# Patient Record
Sex: Male | Born: 1940 | Race: Black or African American | Hispanic: No | Marital: Married | Smoking: Never smoker
Health system: Southern US, Community
[De-identification: ages and names within clinical notes are randomized; demographics above are authoritative.]

## PROBLEM LIST (undated history)

## (undated) DIAGNOSIS — Z8619 Personal history of other infectious and parasitic diseases: Secondary | ICD-10-CM

## (undated) DIAGNOSIS — A770 Spotted fever due to Rickettsia rickettsii: Secondary | ICD-10-CM

## (undated) DIAGNOSIS — D6182 Myelophthisis: Secondary | ICD-10-CM

## (undated) DIAGNOSIS — J45909 Unspecified asthma, uncomplicated: Secondary | ICD-10-CM

## (undated) DIAGNOSIS — Z87438 Personal history of other diseases of male genital organs: Secondary | ICD-10-CM

## (undated) DIAGNOSIS — Z87440 Personal history of urinary (tract) infections: Secondary | ICD-10-CM

## (undated) DIAGNOSIS — G4733 Obstructive sleep apnea (adult) (pediatric): Secondary | ICD-10-CM

## (undated) DIAGNOSIS — C61 Malignant neoplasm of prostate: Secondary | ICD-10-CM

## (undated) DIAGNOSIS — K5900 Constipation, unspecified: Secondary | ICD-10-CM

## (undated) DIAGNOSIS — Z8711 Personal history of peptic ulcer disease: Secondary | ICD-10-CM

## (undated) DIAGNOSIS — R32 Unspecified urinary incontinence: Secondary | ICD-10-CM

## (undated) DIAGNOSIS — E785 Hyperlipidemia, unspecified: Secondary | ICD-10-CM

## (undated) DIAGNOSIS — Z8719 Personal history of other diseases of the digestive system: Secondary | ICD-10-CM

## (undated) HISTORY — DX: Personal history of other diseases of the digestive system: Z87.19

## (undated) HISTORY — DX: Personal history of other diseases of male genital organs: Z87.438

## (undated) HISTORY — DX: Personal history of other infectious and parasitic diseases: Z86.19

## (undated) HISTORY — DX: Myelophthisis: D61.82

## (undated) HISTORY — DX: Unspecified urinary incontinence: R32

## (undated) HISTORY — DX: Hyperlipidemia, unspecified: E78.5

## (undated) HISTORY — DX: Personal history of peptic ulcer disease: Z87.11

## (undated) HISTORY — PX: ANAL FISSURE REPAIR: SHX2312

## (undated) HISTORY — DX: Unspecified asthma, uncomplicated: J45.909

## (undated) HISTORY — DX: Spotted fever due to Rickettsia rickettsii: A77.0

## (undated) HISTORY — DX: Constipation, unspecified: K59.00

## (undated) HISTORY — DX: Personal history of urinary (tract) infections: Z87.440

## (undated) HISTORY — DX: Malignant neoplasm of prostate: C61

## (undated) HISTORY — DX: Obstructive sleep apnea (adult) (pediatric): G47.33

---

## 1999-05-19 ENCOUNTER — Encounter: Payer: Self-pay | Admitting: *Deleted

## 1999-05-19 ENCOUNTER — Ambulatory Visit (HOSPITAL_COMMUNITY): Admission: RE | Admit: 1999-05-19 | Discharge: 1999-05-19 | Payer: Self-pay | Admitting: *Deleted

## 1999-11-19 ENCOUNTER — Emergency Department (HOSPITAL_COMMUNITY): Admission: EM | Admit: 1999-11-19 | Discharge: 1999-11-19 | Payer: Self-pay | Admitting: Emergency Medicine

## 2000-02-03 ENCOUNTER — Emergency Department (HOSPITAL_COMMUNITY): Admission: EM | Admit: 2000-02-03 | Discharge: 2000-02-03 | Payer: Self-pay | Admitting: Emergency Medicine

## 2000-02-03 ENCOUNTER — Encounter: Payer: Self-pay | Admitting: Emergency Medicine

## 2000-05-04 ENCOUNTER — Emergency Department (HOSPITAL_COMMUNITY): Admission: EM | Admit: 2000-05-04 | Discharge: 2000-05-04 | Payer: Self-pay | Admitting: *Deleted

## 2002-02-08 ENCOUNTER — Encounter: Admission: RE | Admit: 2002-02-08 | Discharge: 2002-02-08 | Payer: Self-pay | Admitting: Family Medicine

## 2002-02-08 ENCOUNTER — Encounter: Payer: Self-pay | Admitting: Family Medicine

## 2002-03-12 ENCOUNTER — Encounter: Payer: Self-pay | Admitting: Family Medicine

## 2002-03-12 ENCOUNTER — Encounter: Admission: RE | Admit: 2002-03-12 | Discharge: 2002-03-12 | Payer: Self-pay | Admitting: Family Medicine

## 2002-05-10 ENCOUNTER — Encounter: Admission: RE | Admit: 2002-05-10 | Discharge: 2002-08-08 | Payer: Self-pay | Admitting: Occupational Medicine

## 2008-09-25 ENCOUNTER — Ambulatory Visit (HOSPITAL_BASED_OUTPATIENT_CLINIC_OR_DEPARTMENT_OTHER): Admission: RE | Admit: 2008-09-25 | Discharge: 2008-09-25 | Payer: Self-pay | Admitting: Family Medicine

## 2008-09-29 ENCOUNTER — Ambulatory Visit: Payer: Self-pay | Admitting: Internal Medicine

## 2010-08-23 DIAGNOSIS — A770 Spotted fever due to Rickettsia rickettsii: Secondary | ICD-10-CM

## 2010-08-23 DIAGNOSIS — G4733 Obstructive sleep apnea (adult) (pediatric): Secondary | ICD-10-CM

## 2010-08-23 HISTORY — DX: Spotted fever due to Rickettsia rickettsii: A77.0

## 2010-08-23 HISTORY — DX: Obstructive sleep apnea (adult) (pediatric): G47.33

## 2010-09-17 ENCOUNTER — Encounter
Admission: RE | Admit: 2010-09-17 | Discharge: 2010-09-17 | Payer: Self-pay | Source: Home / Self Care | Attending: Orthopedic Surgery | Admitting: Orthopedic Surgery

## 2011-01-05 NOTE — Procedures (Signed)
NAME:  Zachary Mcguire, Zachary Mcguire NO.:  [id number]   MEDICAL RECORD NO.:  [id number]          PATIENT TYPE:  OUT   LOCATION:  SLEEP CENTER                 FACILITY:  [facility]   PHYSICIAN:  Clinton D. Maple Hudson, MD, FCCP, FACPDATE OF BIRTH:  1940/10/18   DATE OF STUDY:  09/25/2008                            NOCTURNAL POLYSOMNOGRAM   REFERRING PHYSICIAN:  Elvina Sidle, M.D.   INDICATION FOR STUDY:  Hypersomnia with sleep apnea.   EPWORTH SLEEPINESS SCORE:  12/24.  BMI 31.6, weight 220 pounds, height  70 inches, neck 16.5 inches.   MEDICATIONS:  Home medications charted and reviewed.   SLEEP ARCHITECTURE:  Split study protocol.  During the diagnostic phase,  total sleep time 125 minutes with sleep efficiency 75.5%.  Stage I was  16.4%.  Stage II 67.6%.  Stage III absent.  REM 16% of total sleep time.  Sleep latency 5 minutes.  REM latency 53.5 minutes.  Awake after sleep  onset 28.5 minutes.  Arousal index 49.4 indicating increased EEG  arousal.  He had taken 1 puff of an albuterol inhaler at bedtime and  again at 12:30 a.m.   RESPIRATORY DATA:  Split study protocol.  Apnea hypopnea index (AHI) 47  per hour.  A total of 98 events was scored including 94 obstructive  apneas and 4 hypopneas.  All events were recorded as supine.  REM AHI 78  per hour.  CPAP was titrated to 11 CWP, AHI 0 per hour.  He chose a  small [employer] attorney with heated humidifier.   OXYGEN DATA:  Loud snoring before CPAP with oxygen desaturation to a  nadir of 83%.  After CPAP control, mean oxygen saturation was 96.1% on  room air.   CARDIAC DATA:  Sinus rhythm with occasional PAC and PVC.   MOVEMENT-PARASOMNIA:  No significant movement disturbance.  Bathroom x2.   IMPRESSIONS-RECOMMENDATIONS:  1. Moderately severe obstructive sleep apnea/hypopnea syndrome, AHI 47      per hour with most events recorded as supine.  Loud snoring with      oxygen saturation to a nadir of 83%.  2.  Successful CPAP titration to 11 CWP, AHI 0 per hour.  He chose a      small Patent attorney with heated humidifier.      Clinton D. Maple Hudson, MD, [facility], FACP  Diplomate, Biomedical engineer of Sleep Medicine  Electronically Signed     CDY/MEDQ  D:  09/28/2008 10:52:42  T:  09/29/2008 04:16:33  Job:  [id number]

## 2011-02-04 ENCOUNTER — Emergency Department (HOSPITAL_COMMUNITY)
Admission: EM | Admit: 2011-02-04 | Discharge: 2011-02-04 | Disposition: A | Discharge status: Home / Self Care | Payer: Medicare Other | Attending: Emergency Medicine | Admitting: Emergency Medicine

## 2011-02-04 DIAGNOSIS — IMO0001 Reserved for inherently not codable concepts without codable children: Secondary | ICD-10-CM | POA: Insufficient documentation

## 2011-08-02 ENCOUNTER — Ambulatory Visit (INDEPENDENT_AMBULATORY_CARE_PROVIDER_SITE_OTHER): Payer: Medicare Other

## 2011-08-02 DIAGNOSIS — R1031 Right lower quadrant pain: Secondary | ICD-10-CM

## 2011-08-10 ENCOUNTER — Ambulatory Visit: Payer: Medicare Other

## 2011-08-11 ENCOUNTER — Telehealth: Payer: Self-pay | Admitting: *Deleted

## 2011-08-11 NOTE — Telephone Encounter (Signed)
left message informing the patient of the new date and time on 08-31-2011 starting at 10:30am

## 2011-08-13 ENCOUNTER — Telehealth: Payer: Self-pay | Admitting: Oncology

## 2011-08-13 NOTE — Telephone Encounter (Signed)
Referred by Dr. Teodoro Kil DX- Prostate Ca

## 2011-08-23 ENCOUNTER — Encounter: Payer: Self-pay | Admitting: *Deleted

## 2011-08-27 ENCOUNTER — Other Ambulatory Visit: Payer: Self-pay | Admitting: Oncology

## 2011-08-27 DIAGNOSIS — C61 Malignant neoplasm of prostate: Secondary | ICD-10-CM

## 2011-08-31 ENCOUNTER — Ambulatory Visit: Payer: Medicare Other

## 2011-08-31 ENCOUNTER — Ambulatory Visit: Payer: Medicare Other | Admitting: Oncology

## 2011-08-31 ENCOUNTER — Other Ambulatory Visit: Payer: Medicare Other | Admitting: Lab

## 2011-09-09 ENCOUNTER — Ambulatory Visit (INDEPENDENT_AMBULATORY_CARE_PROVIDER_SITE_OTHER): Payer: Medicare Other | Admitting: Family Medicine

## 2011-09-09 DIAGNOSIS — B356 Tinea cruris: Secondary | ICD-10-CM

## 2011-09-09 DIAGNOSIS — N411 Chronic prostatitis: Secondary | ICD-10-CM

## 2011-11-30 ENCOUNTER — Ambulatory Visit (INDEPENDENT_AMBULATORY_CARE_PROVIDER_SITE_OTHER): Payer: Medicare Other | Admitting: Family Medicine

## 2011-11-30 VITALS — BP 138/66 | HR 76 | Temp 98.2°F | Resp 20 | Ht 69.0 in | Wt 187.0 lb

## 2011-11-30 DIAGNOSIS — M5430 Sciatica, unspecified side: Secondary | ICD-10-CM

## 2011-11-30 DIAGNOSIS — N451 Epididymitis: Secondary | ICD-10-CM

## 2011-11-30 MED ORDER — GABAPENTIN 300 MG PO CAPS: 300.0000 mg | ORAL_CAPSULE | Freq: Every evening | ORAL | Status: DC | PRN

## 2011-11-30 MED ORDER — METHYLPREDNISOLONE ACETATE 80 MG/ML IJ SUSP
120.0000 mg | Freq: Once | INTRAMUSCULAR | Status: AC
  Administered 2011-11-30: 120 mg via INTRAMUSCULAR

## 2011-11-30 MED ORDER — CIPROFLOXACIN HCL 500 MG PO TABS: 500.0000 mg | ORAL_TABLET | Freq: Two times a day (BID) | ORAL | Status: AC

## 2011-11-30 NOTE — Patient Instructions (Signed)
Epididymitis Epididymitis is a swelling (inflammation) of the epididymis. The epididymis is a cord-like structure along the back part of the testicle. Epididymitis is usually, but not always, caused by infection. This is usually a sudden problem beginning with chills, fever and pain behind the scrotum and in the testicle. There may be swelling and redness of the testicle. DIAGNOSIS  Physical examination will reveal a tender, swollen epididymis. Sometimes, cultures are obtained from the urine or from prostate secretions to help find out if there is an infection or if the cause is a different problem. Sometimes, blood work is performed to see if your white blood cell count is elevated and if a germ (bacterial) or viral infection is present. Using this knowledge, an appropriate medicine which kills germs (antibiotic) can be chosen by your caregiver. A viral infection causing epididymitis will most often go away (resolve) without treatment. HOME CARE INSTRUCTIONS   Hot sitz baths for 20 minutes, 4 times per day, may help relieve pain.   Only take over-the-counter or prescription medicines for pain, discomfort or fever as directed by your caregiver.   Take all medicines, including antibiotics, as directed. Take the antibiotics for the full prescribed length of time even if you are feeling better.   It is very important to keep all follow-up appointments.  SEEK IMMEDIATE MEDICAL CARE IF:   You have a fever.   You have pain not relieved with medicines.   You have any worsening of your problems.   Your pain seems to come and go.   You develop pain, redness, and swelling in the scrotum and surrounding areas.  MAKE SURE YOU:   Understand these instructions.   Will watch your condition.   Will get help right away if you are not doing well or get worse.  Document Released: 08/06/2000 Document Revised: 07/29/2011 Document Reviewed: 06/26/2009 [facility] Patient Information 2012 [place],  [place].Sciatica Sciatica is a weakness and/or changes in sensation (tingling, jolts, hot and cold, numbness) along the path the sciatic nerve travels. Irritation or damage to lumbar nerve roots is often also referred to as lumbar radiculopathy.  Lumbar radiculopathy (Sciatica) is the most common form of this problem. Radiculopathy can occur in any of the nerves coming out of the spinal cord. The problems caused depend on which nerves are involved. The sciatic nerve is the large nerve supplying the branches of nerves going from the hip to the toes. It often causes a numbness or weakness in the skin and/or muscles that the sciatic nerve serves. It also may cause symptoms (problems) of pain, burning, tingling, or electric shock-like feelings in the path of this nerve. This usually comes from injury to the fibers that make up the sciatic nerve. Some of these symptoms are low back pain and/or unpleasant feelings in the following areas:  From the mid-buttock down the back of the leg to the back of the knee.   And/or the outside of the calf and top of the foot.   And/or behind the inner ankle to the sole of the foot.  CAUSES   Herniated or slipped disc. Discs are the little cushions between the bones in the back.   Pressure by the piriformis muscle in the buttock on the sciatic nerve (Piriformis Syndrome).   Misalignment of the bones in the lower back and buttocks (Sacroiliac Joint Derangement).   Narrowing of the spinal canal that puts pressure on or pinches the fibers that make up the sciatic nerve.   A slipped vertebra that  is out of line with those above or beneath it.   Abnormality of the nervous system itself so that nerve fibers do not transmit signals properly, especially to feet and calves (neuropathy).   Tumor (this is rare).  Your caregiver can usually determine the cause of your sciatica and begin the treatment most likely to help you. TREATMENT  Taking over-the-counter painkillers,  physical therapy, rest, exercise, spinal manipulation, and injections of anesthetics and/or steroids may be used. Surgery, acupuncture, and Yoga can also be effective. Mind over matter techniques, mental imagery, and changing factors such as your bed, chair, desk height, posture, and activities are other treatments that may be helpful. You and your caregiver can help determine what is best for you. With proper diagnosis, the cause of most sciatica can be identified and removed. Communication and cooperation between your caregiver and you is essential. If you are not successful immediately, do not be discouraged. With time, a proper treatment can be found that will make you comfortable. HOME CARE INSTRUCTIONS   If the pain is coming from a problem in the back, applying ice to that area for 15 to 20 minutes, 3 to 4 times per day while awake, may be helpful. Put the ice in a plastic bag. Place a towel between the bag of ice and your skin.   You may exercise or perform your usual activities if these do not aggravate your pain, or as suggested by your caregiver.   Only take over-the-counter or prescription medicines for pain, discomfort, or fever as directed by your caregiver.   If your caregiver has given you a follow-up appointment, it is very important to keep that appointment. Not keeping the appointment could result in a chronic or permanent injury, pain, and disability. If there is any problem keeping the appointment, you must call back to this facility for assistance.  SEEK IMMEDIATE MEDICAL CARE IF:   You experience loss of control of bowel or bladder.   You have increasing weakness in the trunk, buttocks, or legs.   There is numbness in any areas from the hip down to the toes.   You have difficulty walking or keeping your balance.   You have any of the above, with fever or forceful vomiting.  Document Released: 08/03/2001 Document Revised: 07/29/2011 Document Reviewed: 03/22/2008 [facility]  Patient Information 2012 [place], [place].

## 2011-11-30 NOTE — Progress Notes (Signed)
71  Yo with known prostate cancer metastatic to bone.  He has now had 1 week of sciatic type pain right leg, and groin.  He has trouble with long car rides to mother in [place], [place] with aching in entire leg and groin.  He notes numbness in posterior right thigh, and pain on right great toe.  Slight right testicle pain  O:  NAD  Seen with wife SLR negative Good muscle strength Gait:  Slow and slightly antalgic Nontender right leg..  Area of ecchymosis base of 2nd toe where he dropped something on it the other day.

## 2012-02-18 ENCOUNTER — Ambulatory Visit (INDEPENDENT_AMBULATORY_CARE_PROVIDER_SITE_OTHER): Payer: Medicare Other | Admitting: Family Medicine

## 2012-02-18 ENCOUNTER — Ambulatory Visit: Payer: Medicare Other

## 2012-02-18 VITALS — BP 126/70 | HR 60 | Temp 98.3°F | Resp 18 | Ht 70.5 in | Wt 183.0 lb

## 2012-02-18 DIAGNOSIS — Z0000 Encounter for general adult medical examination without abnormal findings: Secondary | ICD-10-CM

## 2012-02-18 DIAGNOSIS — M79604 Pain in right leg: Secondary | ICD-10-CM

## 2012-02-18 DIAGNOSIS — M25461 Effusion, right knee: Secondary | ICD-10-CM

## 2012-02-18 DIAGNOSIS — C61 Malignant neoplasm of prostate: Secondary | ICD-10-CM

## 2012-02-18 DIAGNOSIS — M25569 Pain in unspecified knee: Secondary | ICD-10-CM

## 2012-02-18 DIAGNOSIS — M25559 Pain in unspecified hip: Secondary | ICD-10-CM

## 2012-02-18 DIAGNOSIS — M5430 Sciatica, unspecified side: Secondary | ICD-10-CM

## 2012-02-18 DIAGNOSIS — G4700 Insomnia, unspecified: Secondary | ICD-10-CM

## 2012-02-18 LAB — POCT URINALYSIS DIPSTICK
Bilirubin, UA: NEGATIVE
Glucose, UA: NEGATIVE
Ketones, UA: NEGATIVE
Leukocytes, UA: NEGATIVE
Nitrite, UA: NEGATIVE
Protein, UA: NEGATIVE
Spec Grav, UA: 1.015
Urobilinogen, UA: 0.2
pH, UA: 6.5

## 2012-02-18 LAB — POCT CBC
Granulocyte percent: 55.2 %G (ref 37–80)
HCT, POC: 36.4 % — AB (ref 43.5–53.7)
Hemoglobin: 11 g/dL — AB (ref 14.1–18.1)
Lymph, poc: 1.9 (ref 0.6–3.4)
MCH, POC: 28.9 pg (ref 27–31.2)
MCHC: 30.2 g/dL — AB (ref 31.8–35.4)
MCV: 95.7 fL (ref 80–97)
MID (cbc): 0.3 (ref 0–0.9)
MPV: 7.5 fL (ref 0–99.8)
POC Granulocyte: 2.8 (ref 2–6.9)
POC LYMPH PERCENT: 38.5 %L (ref 10–50)
POC MID %: 6.3 %M (ref 0–12)
Platelet Count, POC: 211 10*3/uL (ref 142–424)
RBC: 3.8 M/uL — AB (ref 4.69–6.13)
RDW, POC: 17.7 %
WBC: 5 10*3/uL (ref 4.6–10.2)

## 2012-02-18 LAB — POCT UA - MICROSCOPIC ONLY
Bacteria, U Microscopic: NEGATIVE
Casts, Ur, LPF, POC: NEGATIVE
Crystals, Ur, HPF, POC: NEGATIVE
Epithelial cells, urine per micros: NEGATIVE
Mucus, UA: NEGATIVE
WBC, Ur, HPF, POC: NEGATIVE
Yeast, UA: NEGATIVE

## 2012-02-18 LAB — POCT SEDIMENTATION RATE: POCT SED RATE: 63 mm/hr — AB (ref 0–22)

## 2012-02-18 LAB — IFOBT (OCCULT BLOOD): IFOBT: NEGATIVE

## 2012-02-18 MED ORDER — GABAPENTIN 300 MG PO CAPS: 300.0000 mg | ORAL_CAPSULE | Freq: Every evening | ORAL | Status: DC | PRN

## 2012-02-18 MED ORDER — MEGESTROL ACETATE 40 MG/ML PO SUSP: 200.0000 mg | Freq: Every day | ORAL | Status: DC

## 2012-02-18 MED ORDER — TRAMADOL HCL 50 MG PO TABS: 50.0000 mg | ORAL_TABLET | Freq: Four times a day (QID) | ORAL | Status: DC | PRN

## 2012-02-18 MED ORDER — ALPRAZOLAM 0.5 MG PO TABS: 0.5000 mg | ORAL_TABLET | Freq: Every evening | ORAL | Status: DC | PRN

## 2012-02-18 NOTE — Patient Instructions (Addendum)
@[facility] @  Patient ID: KEEGAN DUCEY MRN: [id number], DOB: 1941-02-23 71 y.o. Date of Encounter: 02/18/2012, 10:36 AM  Primary Physician: Elvina Sidle, MD  Chief Complaint: Physical (CPE)  HPI: 71 y.o. y/o male with history noted below here for CPE.  C/o right leg pain and right outer knee swelling.  The knee is not hurting and has a h/o of knee effusion aspiration several years ago by Dr. Eda Keys to self-cath because of the prostate cancer Weight loss continues. Asks for more megace  Review of Systems: Consitutional: No fever, chills, fatigue, night sweats, lymphadenopathy, or weight changes. Eyes: No visual changes, eye redness, or discharge. ENT/Mouth: Ears: No otalgia, tinnitus, hearing loss, discharge. Nose: No congestion, rhinorrhea, sinus pain, or epistaxis. Throat: No sore throat, post nasal drip, or teeth pain. Cardiovascular: No CP, palpitations, diaphoresis, DOE, edema, orthopnea, PND. Respiratory: No cough, hemoptysis, SOB, or wheezing. Gastrointestinal: No anorexia, dysphagia, reflux, pain, nausea, vomiting, hematemesis, diarrhea, constipation, BRBPR, or melena. Genitourinary: No dysuria, frequency, urgency, hematuria, incontinence, nocturia, discharge, impotence, or testicular pain/masses.  Self-cath Musculoskeletal: No decreased ROM, myalgias,or weakness. Skin: No rash, erythema, lesion changes, pain, warmth, jaundice, or pruritis. Neurological: No headache, dizziness, syncope, seizures, tremors, memory loss, coordination problems, or paresthesias. Psychological: No anxiety, depression, hallucinations, SI/HI. Endocrine: No fatigue, polydipsia, polyphagia, polyuria, or known diabetes. All other systems were reviewed and are otherwise negative.  Past Medical History  Diagnosis Date  . Cancer   . Sleep apnea      No past surgical history on file.  Home Meds:  Prior to Admission medications   Medication Sig Start Date End Date Taking? Authorizing  Provider  ALPRAZolam Prudy Feeler) 0.5 MG tablet Take 0.5 mg by mouth at bedtime as needed.   Yes Historical Provider, MD  megestrol (MEGACE) 40 MG/ML suspension Take 200 mg by mouth daily.   Yes Historical Provider, MD  traMADol (ULTRAM) 50 MG tablet Take 50 mg by mouth every 6 (six) hours as needed.   Yes Historical Provider, MD  gabapentin (NEURONTIN) 300 MG capsule Take 1 capsule (300 mg total) by mouth at bedtime as needed. 11/30/11 11/29/12  Elvina Sidle, MD    Allergies: No Known Allergies  History   Social History  . Marital Status: Married    Spouse Name: N/A    Number of Children: N/A  . Years of Education: N/A   Occupational History  . Not on file.   Social History Main Topics  . Smoking status: Never Smoker   . Smokeless tobacco: Not on file  . Alcohol Use: Not on file  . Drug Use: Not on file  . Sexually Active: Not on file   Other Topics Concern  . Not on file   Social History Narrative  . No narrative on file    No family history on file.  Physical Exam: Blood pressure 126/70, pulse 60, temperature 98.3 F (36.8 C), temperature source Oral, resp. rate 18, height 5' 10.5" (1.791 m), weight 183 lb (83.008 kg), SpO2 98.00%.  General: Well developed, well nourished, in no acute distress. HEENT: Normocephalic, atraumatic. Conjunctiva pink, sclera non-icteric. Pupils 2 mm constricting to 1 mm, round, regular, and equally reactive to light and accomodation. EOMI. Internal auditory canal clear. TMs with good cone of light and without pathology. Nasal mucosa pink. Nares are without discharge. No sinus tenderness. Oral mucosa pink. Dentition good. Pharynx without exudate.   Neck: Supple. Trachea midline. No thyromegaly. Full ROM. No lymphadenopathy. Lungs: Clear to auscultation bilaterally without wheezes, rales, or  rhonchi. Breathing is of normal effort and unlabored. Cardiovascular: RRR with S1 S2. No murmurs, rubs, or gallops appreciated. Distal pulses 2+ symmetrically. No  carotid or abdominal bruits. Abdomen: Soft, non-tender, non-distended with normoactive bowel sounds. No hepatosplenomegaly or masses. No rebound/guarding. No CVA tenderness. Without hernias.  Rectal: No external hemorrhoids or fissures.   Large prostate Genitourinary:  circumcised male. No penile lesions. Testes descended bilaterally, and smooth without tenderness or masses.  Musculoskeletal: Full range of motion and 5/5 strength throughout. Without atrophy, tenderness, crepitus, or warmth. Extremities without clubbing, cyanosis, or edema. Calves supple.  Outer right knee joint line swelling Skin: Warm and moist without erythema, ecchymosis, wounds, or rash. Neuro: A+Ox3. CN II-XII grossly intact. Moves all extremities spontaneously. Full sensation throughout. Normal gait. DTR 2+ throughout upper and lower extremities. Finger to nose intact. Psych:  Responds to questions appropriately with a normal affect.    [facility] reading (PRIMARY) by  Dr. Milus Glazier: X-ray of leg shows lytic lesion, CXR shows no lesions Results for orders placed in visit on 02/18/12  POCT CBC      Component Value Range   WBC 5.0  4.6 - 10.2 K/uL   Lymph, poc 1.9  0.6 - 3.4   POC LYMPH PERCENT 38.5  10 - 50 %L   MID (cbc) 0.3  0 - 0.9   POC MID % 6.3  0 - 12 %M   POC Granulocyte 2.8  2 - 6.9   Granulocyte percent 55.2  37 - 80 %G   RBC 3.80 (*) 4.69 - 6.13 M/uL   Hemoglobin 11.0 (*) 14.1 - 18.1 g/dL   HCT, POC 16.1 (*) 09.6 - 53.7 %   MCV 95.7  80 - 97 fL   MCH, POC 28.9  27 - 31.2 pg   MCHC 30.2 (*) 31.8 - 35.4 g/dL   RDW, POC 04.5     Platelet Count, POC 211  142 - 424 K/uL   MPV 7.5  0 - 99.8 fL  POCT URINALYSIS DIPSTICK      Component Value Range   Color, UA yellow     Clarity, UA clear     Glucose, UA neg     Bilirubin, UA neg     Ketones, UA neg     Spec Grav, UA 1.015     Blood, UA trace     pH, UA 6.5     Protein, UA neg     Urobilinogen, UA 0.2     Nitrite, UA neg     Leukocytes, UA Negative      POCT UA - MICROSCOPIC ONLY      Component Value Range   WBC, Ur, HPF, POC neg     RBC, urine, microscopic 0-1     Bacteria, U Microscopic neg     Mucus, UA neg     Epithelial cells, urine per micros neg     Crystals, Ur, HPF, POC neg     Casts, Ur, LPF, POC neg     Yeast, UA neg    IFOBT (OCCULT BLOOD)      Component Value Range   IFOBT Negative      Assessment/Plan:  71 y.o. y/o married male here for CPE with prostate cancer.  I have offered and encouraged him to get treatment for the prostate ca with ortho or oncology in the presence of his wife.  They will "THINK ABOUT IT" - 1. Healthcare maintenance  DG Hip Complete Right, POCT CBC, POCT SEDIMENTATION RATE,  POCT urinalysis dipstick, POCT UA - Microscopic Only, Comprehensive metabolic panel, IFOBT POC (occult bld, rslt in office)  2. Leg pain, right  DG Hip Complete Right, POCT SEDIMENTATION RATE, traMADol (ULTRAM) 50 MG tablet, gabapentin (NEURONTIN) 300 MG capsule  3. Prostate cancer  POCT CBC  4. Swelling of knee joint, right  DG Knee Complete 4 Views Right  5. Prostate ca  EKG 12-Lead, DG Chest 2 View, megestrol (MEGACE) 40 MG/ML suspension  6. Sciatica  gabapentin (NEURONTIN) 300 MG capsule  7. Insomnia  ALPRAZolam (XANAX) 0.5 MG tablet    Signed, Elvina Sidle, MD 02/18/2012 10:36 AM

## 2012-02-18 NOTE — Progress Notes (Signed)
@[facility] @  Patient ID: Zachary Mcguire MRN: [id number], DOB: May 25, 1941 71 y.o. Date of Encounter: 02/18/2012, 10:36 AM  Primary Physician: Elvina Sidle, MD  Chief Complaint: Physical (CPE)  HPI: 71 y.o. y/o male with history noted below here for CPE.  C/o right leg pain and right outer knee swelling.  The knee is not hurting and has a h/o of knee effusion aspiration several years ago by Dr. Eda Keys to self-cath because of the prostate cancer Weight loss continues. Asks for more megace  Review of Systems: Consitutional: No fever, chills, fatigue, night sweats, lymphadenopathy, or weight changes. Eyes: No visual changes, eye redness, or discharge. ENT/Mouth: Ears: No otalgia, tinnitus, hearing loss, discharge. Nose: No congestion, rhinorrhea, sinus pain, or epistaxis. Throat: No sore throat, post nasal drip, or teeth pain. Cardiovascular: No CP, palpitations, diaphoresis, DOE, edema, orthopnea, PND. Respiratory: No cough, hemoptysis, SOB, or wheezing. Gastrointestinal: No anorexia, dysphagia, reflux, pain, nausea, vomiting, hematemesis, diarrhea, constipation, BRBPR, or melena. Genitourinary: No dysuria, frequency, urgency, hematuria, incontinence, nocturia, discharge, impotence, or testicular pain/masses.  Self-cath Musculoskeletal: No decreased ROM, myalgias,or weakness. Skin: No rash, erythema, lesion changes, pain, warmth, jaundice, or pruritis. Neurological: No headache, dizziness, syncope, seizures, tremors, memory loss, coordination problems, or paresthesias. Psychological: No anxiety, depression, hallucinations, SI/HI. Endocrine: No fatigue, polydipsia, polyphagia, polyuria, or known diabetes. All other systems were reviewed and are otherwise negative.  Past Medical History  Diagnosis Date  . Cancer   . Sleep apnea      No past surgical history on file.  Home Meds:  Prior to Admission medications   Medication Sig Start Date End Date Taking? Authorizing  Provider  ALPRAZolam Prudy Feeler) 0.5 MG tablet Take 0.5 mg by mouth at bedtime as needed.   Yes Historical Provider, MD  megestrol (MEGACE) 40 MG/ML suspension Take 200 mg by mouth daily.   Yes Historical Provider, MD  traMADol (ULTRAM) 50 MG tablet Take 50 mg by mouth every 6 (six) hours as needed.   Yes Historical Provider, MD  gabapentin (NEURONTIN) 300 MG capsule Take 1 capsule (300 mg total) by mouth at bedtime as needed. 11/30/11 11/29/12  Elvina Sidle, MD    Allergies: No Known Allergies  History   Social History  . Marital Status: Married    Spouse Name: N/A    Number of Children: N/A  . Years of Education: N/A   Occupational History  . Not on file.   Social History Main Topics  . Smoking status: Never Smoker   . Smokeless tobacco: Not on file  . Alcohol Use: Not on file  . Drug Use: Not on file  . Sexually Active: Not on file   Other Topics Concern  . Not on file   Social History Narrative  . No narrative on file    No family history on file.  Physical Exam: Blood pressure 126/70, pulse 60, temperature 98.3 F (36.8 C), temperature source Oral, resp. rate 18, height 5' 10.5" (1.791 m), weight 183 lb (83.008 kg), SpO2 98.00%.  General: Well developed, well nourished, in no acute distress. HEENT: Normocephalic, atraumatic. Conjunctiva pink, sclera non-icteric. Pupils 2 mm constricting to 1 mm, round, regular, and equally reactive to light and accomodation. EOMI. Internal auditory canal clear. TMs with good cone of light and without pathology. Nasal mucosa pink. Nares are without discharge. No sinus tenderness. Oral mucosa pink. Dentition good. Pharynx without exudate.   Neck: Supple. Trachea midline. No thyromegaly. Full ROM. No lymphadenopathy. Lungs: Clear to auscultation bilaterally without wheezes, rales, or  rhonchi. Breathing is of normal effort and unlabored. Cardiovascular: RRR with S1 S2. No murmurs, rubs, or gallops appreciated. Distal pulses 2+ symmetrically. No  carotid or abdominal bruits. Abdomen: Soft, non-tender, non-distended with normoactive bowel sounds. No hepatosplenomegaly or masses. No rebound/guarding. No CVA tenderness. Without hernias.  Rectal: No external hemorrhoids or fissures.   Large prostate Genitourinary:  circumcised male. No penile lesions. Testes descended bilaterally, and smooth without tenderness or masses.  Musculoskeletal: Full range of motion and 5/5 strength throughout. Without atrophy, tenderness, crepitus, or warmth. Extremities without clubbing, cyanosis, or edema. Calves supple.  Outer right knee joint line swelling Skin: Warm and moist without erythema, ecchymosis, wounds, or rash. Neuro: A+Ox3. CN II-XII grossly intact. Moves all extremities spontaneously. Full sensation throughout. Normal gait. DTR 2+ throughout upper and lower extremities. Finger to nose intact. Psych:  Responds to questions appropriately with a normal affect.    MEDICATIONS Use medications only as directed by your physician  [facility] reading (PRIMARY) by  Dr. Milus Glazier: X-ray of leg shows lytic lesion, CXR shows no lesions, leg shows lytic lesion right femur Results for orders placed in visit on 02/18/12  POCT CBC      Component Value Range   WBC 5.0  4.6 - 10.2 K/uL   Lymph, poc 1.9  0.6 - 3.4   POC LYMPH PERCENT 38.5  10 - 50 %L   MID (cbc) 0.3  0 - 0.9   POC MID % 6.3  0 - 12 %M   POC Granulocyte 2.8  2 - 6.9   Granulocyte percent 55.2  37 - 80 %G   RBC 3.80 (*) 4.69 - 6.13 M/uL   Hemoglobin 11.0 (*) 14.1 - 18.1 g/dL   HCT, POC 16.1 (*) 09.6 - 53.7 %   MCV 95.7  80 - 97 fL   MCH, POC 28.9  27 - 31.2 pg   MCHC 30.2 (*) 31.8 - 35.4 g/dL   RDW, POC 04.5     Platelet Count, POC 211  142 - 424 K/uL   MPV 7.5  0 - 99.8 fL  POCT URINALYSIS DIPSTICK      Component Value Range   Color, UA yellow     Clarity, UA clear     Glucose, UA neg     Bilirubin, UA neg     Ketones, UA neg     Spec Grav, UA 1.015     Blood, UA trace     pH, UA 6.5      Protein, UA neg     Urobilinogen, UA 0.2     Nitrite, UA neg     Leukocytes, UA Negative    POCT UA - MICROSCOPIC ONLY      Component Value Range   WBC, Ur, HPF, POC neg     RBC, urine, microscopic 0-1     Bacteria, U Microscopic neg     Mucus, UA neg     Epithelial cells, urine per micros neg     Crystals, Ur, HPF, POC neg     Casts, Ur, LPF, POC neg     Yeast, UA neg    IFOBT (OCCULT BLOOD)      Component Value Range   IFOBT Negative      Assessment/Plan:  71 y.o. y/o married male here for CPE with prostate cancer.  I have offered and encouraged him to get treatment for the prostate ca with ortho or oncology in the presence of his wife.  They will "THINK ABOUT  IT" - 1. Healthcare maintenance  DG Hip Complete Right, POCT CBC, POCT SEDIMENTATION RATE, POCT urinalysis dipstick, POCT UA - Microscopic Only, Comprehensive metabolic panel, IFOBT POC (occult bld, rslt in office)  2. Leg pain, right  DG Hip Complete Right, POCT SEDIMENTATION RATE, traMADol (ULTRAM) 50 MG tablet, gabapentin (NEURONTIN) 300 MG capsule  3. Prostate cancer  POCT CBC  4. Swelling of knee joint, right  DG Knee Complete 4 Views Right  5. Prostate ca  EKG 12-Lead, DG Chest 2 View, megestrol (MEGACE) 40 MG/ML suspension  6. Sciatica  gabapentin (NEURONTIN) 300 MG capsule  7. Insomnia  ALPRAZolam (XANAX) 0.5 MG tablet    Signed, Elvina Sidle, MD 02/18/2012 10:36 AM

## 2012-02-19 LAB — COMPREHENSIVE METABOLIC PANEL
ALT: 13 U/L (ref 0–53)
AST: 15 U/L (ref 0–37)
Albumin: 4 g/dL (ref 3.5–5.2)
Alkaline Phosphatase: 725 U/L — ABNORMAL HIGH (ref 39–117)
BUN: 16 mg/dL (ref 6–23)
CO2: 26 mEq/L (ref 19–32)
Calcium: 8.5 mg/dL (ref 8.4–10.5)
Chloride: 109 mEq/L (ref 96–112)
Creat: 1.06 mg/dL (ref 0.50–1.35)
Glucose, Bld: 87 mg/dL (ref 70–99)
Potassium: 4.8 mEq/L (ref 3.5–5.3)
Sodium: 140 mEq/L (ref 135–145)
Total Bilirubin: 0.5 mg/dL (ref 0.3–1.2)
Total Protein: 6.6 g/dL (ref 6.0–8.3)

## 2012-02-20 ENCOUNTER — Encounter: Payer: Self-pay | Admitting: Family Medicine

## 2012-02-23 ENCOUNTER — Telehealth: Payer: Self-pay

## 2012-02-23 NOTE — Telephone Encounter (Signed)
Pt called for his labs also wants a copy of the report.  Please call [phone] or wifes cell [phone]  [facility] to [facility] [phone]

## 2012-02-23 NOTE — Telephone Encounter (Signed)
Dr. Elbert Ewings can you review these labs and let me know what to tell this patient?  Thanks

## 2012-02-23 NOTE — Telephone Encounter (Signed)
Dr. Elbert Ewings pt has considered radiation for his previous injury.  He is not ready at this time, but will consider it in the future.  [phone]

## 2012-02-24 NOTE — Telephone Encounter (Signed)
Dr L, can you please comment on pts labs again - your notes under pt's labs in chart have disappeared French Ana stated that she could see them w/results when you first wrote them) and we do not know what you want to let the pt know about them.

## 2012-02-25 NOTE — Telephone Encounter (Signed)
Labs show a mild anemia.  Recommend daily vitamin and recheck in 6 weeks  Results for orders placed in visit on 02/18/12   POCT CBC   Component  Value  Range    WBC  5.0  4.6 - 10.2 K/uL    Lymph, poc  1.9  0.6 - 3.4    POC LYMPH PERCENT  38.5  10 - 50 %L    MID (cbc)  0.3  0 - 0.9    POC MID %  6.3  0 - 12 %M    POC Granulocyte  2.8  2 - 6.9    Granulocyte percent  55.2  37 - 80 %G    RBC  3.80 (*)  4.69 - 6.13 M/uL    Hemoglobin  11.0 (*)  14.1 - 18.1 g/dL    HCT, POC  40.9 (*)  43.5 - 53.7 %    MCV  95.7  80 - 97 fL    MCH, POC  28.9  27 - 31.2 pg    MCHC  30.2 (*)  31.8 - 35.4 g/dL    RDW, POC  81.1     Platelet Count, POC  211  142 - 424 K/uL    MPV  7.5  0 - 99.8 fL   POCT URINALYSIS DIPSTICK   Component  Value  Range    Color, UA  yellow     Clarity, UA  clear     Glucose, UA  neg     Bilirubin, UA  neg     Ketones, UA  neg     Spec Grav, UA  1.015     Blood, UA  trace     pH, UA  6.5     Protein, UA  neg     Urobilinogen, UA  0.2     Nitrite, UA  neg     Leukocytes, UA  Negative    POCT UA - MICROSCOPIC ONLY   Component  Value  Range    WBC, Ur, HPF, POC  neg     RBC, urine, microscopic  0-1     Bacteria, U Microscopic  neg     Mucus, UA  neg     Epithelial cells, urine per micros  neg     Crystals, Ur, HPF, POC  neg     Casts, Ur, LPF, POC  neg     Yeast, UA  neg    IFOBT (OCCULT BLOOD)   Component  Value  Range    IFOBT  Negative

## 2012-02-26 NOTE — Telephone Encounter (Signed)
Dr L. Can you please review CMET so we can send pt a copy.

## 2012-02-27 ENCOUNTER — Encounter: Payer: Self-pay | Admitting: *Deleted

## 2012-02-27 NOTE — Telephone Encounter (Signed)
CMEt shows that the prostate has gone to the bone.  Kidneys and liver are working fine  Value Range Sodium 140 135 - 145 mEq/L Potassium 4.8 3.5 - 5.3 mEq/L Chloride 109 96 - 112 mEq/L CO2 26 19 - 32 mEq/L Glucose, Bld 87 70 - 99 mg/dL BUN 16 6 - 23 mg/dL Creat 4.09 8.11 - 9.14 mg/dL Total Bilirubin 0.5 0.3 - 1.2 mg/dL Alkaline Phosphatase 782 (H) 39 - 117 U/L AST 15 0 - 37 U/L ALT 13 0 - 53 U/L Total Protein 6.6 6.0 - 8.3 g/dL Albumin 4.0 3.5 - 5.2 g/dL Calcium

## 2012-02-28 NOTE — Telephone Encounter (Signed)
LMOM on H# to CB for labs

## 2012-02-29 NOTE — Telephone Encounter (Signed)
Spoke w/wife and she stated that someone had been able to reach them w/lab results. H # was wrong in EPIC and I am correcting it.

## 2012-05-03 ENCOUNTER — Encounter: Payer: Self-pay | Admitting: Family Medicine

## 2012-07-31 ENCOUNTER — Ambulatory Visit (INDEPENDENT_AMBULATORY_CARE_PROVIDER_SITE_OTHER): Payer: Medicare Other | Admitting: Family Medicine

## 2012-07-31 ENCOUNTER — Encounter: Payer: Self-pay | Admitting: Family Medicine

## 2012-07-31 VITALS — BP 152/70 | HR 105 | Temp 97.8°F | Resp 16 | Wt 180.0 lb

## 2012-07-31 DIAGNOSIS — M79604 Pain in right leg: Secondary | ICD-10-CM

## 2012-07-31 DIAGNOSIS — R339 Retention of urine, unspecified: Secondary | ICD-10-CM

## 2012-07-31 DIAGNOSIS — M25559 Pain in unspecified hip: Secondary | ICD-10-CM

## 2012-07-31 DIAGNOSIS — C61 Malignant neoplasm of prostate: Secondary | ICD-10-CM

## 2012-07-31 MED ORDER — TRAMADOL HCL 50 MG PO TABS: 50.0000 mg | ORAL_TABLET | Freq: Four times a day (QID) | ORAL | Status: DC | PRN

## 2012-07-31 MED ORDER — MELOXICAM 15 MG PO TABS: 15.0000 mg | ORAL_TABLET | Freq: Every day | ORAL | Status: DC

## 2012-07-31 NOTE — Progress Notes (Signed)
71 yo man with metastatic prostate cancer, now relying on herbs from daughter.  He has stopped self-catheterizing as the flow is better.  His hip continues to deteriorate with pain on weight bearing and he can only get by with crutches.  Hip pain not well controlled.  Diclofenac has helped in the past, but it made him feel dizzy and odd.  Objective:  NAD trendelenberg gait with leg length discrepancy NAD Talkative Prostate is large and very irregular I spent 30 minutes with patient and wife, face to face Assessment:  Metastatic prostate ca.  Improved urination, worsening hip pain  Plan: I've once again offered radiation or palliative therapy for prostate ca.  Patient says he is G-d fearing and does not want intervention at this point.  He is convinced the herbs are helping him. 1. Prostate cancer  PSA, Comprehensive metabolic panel  2. Hip pain  Ambulatory referral to Orthopedic Surgery, traMADol (ULTRAM) 50 MG tablet, meloxicam (MOBIC) 15 MG tablet, Comprehensive metabolic panel  3. Leg pain, right  traMADol (ULTRAM) 50 MG tablet, meloxicam (MOBIC) 15 MG tablet, Comprehensive metabolic panel   (patient wants to know if herbs are lowering his PSA)

## 2012-08-01 LAB — COMPREHENSIVE METABOLIC PANEL
ALT: 14 U/L (ref 0–53)
AST: 34 U/L (ref 0–37)
Albumin: 4.4 g/dL (ref 3.5–5.2)
Alkaline Phosphatase: 954 U/L — ABNORMAL HIGH (ref 39–117)
BUN: 16 mg/dL (ref 6–23)
CO2: 24 mEq/L (ref 19–32)
Calcium: 8.9 mg/dL (ref 8.4–10.5)
Chloride: 99 mEq/L (ref 96–112)
Creat: 1.13 mg/dL (ref 0.50–1.35)
Glucose, Bld: 117 mg/dL — ABNORMAL HIGH (ref 70–99)
Potassium: 4.2 mEq/L (ref 3.5–5.3)
Sodium: 133 mEq/L — ABNORMAL LOW (ref 135–145)
Total Bilirubin: 0.5 mg/dL (ref 0.3–1.2)
Total Protein: 7.3 g/dL (ref 6.0–8.3)

## 2012-08-01 LAB — PSA: PSA: 3783 ng/mL — ABNORMAL HIGH (ref <=4.00)

## 2012-08-03 ENCOUNTER — Telehealth: Payer: Self-pay

## 2012-08-03 ENCOUNTER — Encounter: Payer: Self-pay | Admitting: *Deleted

## 2012-08-03 DIAGNOSIS — M2170 Unequal limb length (acquired), unspecified site: Secondary | ICD-10-CM

## 2012-08-03 NOTE — Telephone Encounter (Signed)
I think he needs a shoe with build up for leg length discrepancy, is this okay?

## 2012-08-03 NOTE — Telephone Encounter (Signed)
PATIENT IS REQUESTING A SCRIPT FOR [facility] - THEY WILL BUILD HIM A SHOE  ATTENTION STEPHANIE [phone]   CBN FOR PATIENT IS [phone]

## 2012-08-04 NOTE — Telephone Encounter (Signed)
Yes, I have printed this, Zachary Mcguire has signed and I faxed to [facility].  I called patient to advise.

## 2012-08-04 NOTE — Telephone Encounter (Signed)
Sure.  Can you arrange this?  He has significant prostate ca mets to his femur and refuses cancer or urologic treatment, so the shoe adjustment is all we have to offer.

## 2012-10-13 ENCOUNTER — Ambulatory Visit
Admission: RE | Admit: 2012-10-13 | Discharge: 2012-10-13 | Disposition: A | Discharge status: Home / Self Care | Payer: Medicare Other | Source: Ambulatory Visit | Attending: Family Medicine | Admitting: Family Medicine

## 2012-10-13 ENCOUNTER — Other Ambulatory Visit: Payer: Self-pay | Admitting: Family Medicine

## 2012-10-13 DIAGNOSIS — M549 Dorsalgia, unspecified: Secondary | ICD-10-CM

## 2012-12-22 ENCOUNTER — Ambulatory Visit
Admission: RE | Admit: 2012-12-22 | Discharge: 2012-12-22 | Disposition: A | Discharge status: Home / Self Care | Payer: Medicare Other | Source: Ambulatory Visit | Attending: Orthopedic Surgery | Admitting: Orthopedic Surgery

## 2012-12-22 ENCOUNTER — Other Ambulatory Visit: Payer: Self-pay | Admitting: Orthopedic Surgery

## 2012-12-22 DIAGNOSIS — C61 Malignant neoplasm of prostate: Secondary | ICD-10-CM

## 2013-02-06 ENCOUNTER — Other Ambulatory Visit: Payer: Self-pay | Admitting: Family Medicine

## 2013-02-15 ENCOUNTER — Telehealth: Payer: Self-pay | Admitting: Family Medicine

## 2013-02-15 NOTE — Telephone Encounter (Signed)
Pt's daughter called and asked if the pt could be seen prior to 04/12/2013 due to his age and some ongoing health issues.  Pt has also been wheelchair bound December, 2013.  Can you accommodate him an earlier appmt? I made pt aware that you are out of the office until Tuesday. Thank you.

## 2013-02-19 NOTE — Telephone Encounter (Signed)
May place July 15th at 10:30am.

## 2013-02-20 NOTE — Telephone Encounter (Signed)
Pt r/s to 07/15 @ 10:30. Left mssg w/pt's wife

## 2013-03-06 ENCOUNTER — Encounter: Payer: Self-pay | Admitting: Family Medicine

## 2013-03-06 ENCOUNTER — Ambulatory Visit (INDEPENDENT_AMBULATORY_CARE_PROVIDER_SITE_OTHER): Payer: Medicare Other | Admitting: Family Medicine

## 2013-03-06 VITALS — BP 128/60 | HR 96 | Temp 98.2°F | Ht 69.0 in | Wt 151.8 lb

## 2013-03-06 DIAGNOSIS — IMO0002 Reserved for concepts with insufficient information to code with codable children: Secondary | ICD-10-CM

## 2013-03-06 DIAGNOSIS — G4733 Obstructive sleep apnea (adult) (pediatric): Secondary | ICD-10-CM

## 2013-03-06 DIAGNOSIS — R609 Edema, unspecified: Secondary | ICD-10-CM

## 2013-03-06 DIAGNOSIS — K5900 Constipation, unspecified: Secondary | ICD-10-CM | POA: Insufficient documentation

## 2013-03-06 DIAGNOSIS — R627 Adult failure to thrive: Secondary | ICD-10-CM

## 2013-03-06 DIAGNOSIS — R600 Localized edema: Secondary | ICD-10-CM | POA: Insufficient documentation

## 2013-03-06 DIAGNOSIS — E46 Unspecified protein-calorie malnutrition: Secondary | ICD-10-CM | POA: Insufficient documentation

## 2013-03-06 DIAGNOSIS — E785 Hyperlipidemia, unspecified: Secondary | ICD-10-CM | POA: Insufficient documentation

## 2013-03-06 DIAGNOSIS — R32 Unspecified urinary incontinence: Secondary | ICD-10-CM

## 2013-03-06 DIAGNOSIS — J45909 Unspecified asthma, uncomplicated: Secondary | ICD-10-CM | POA: Insufficient documentation

## 2013-03-06 DIAGNOSIS — C61 Malignant neoplasm of prostate: Secondary | ICD-10-CM | POA: Insufficient documentation

## 2013-03-06 DIAGNOSIS — C800 Disseminated malignant neoplasm, unspecified: Secondary | ICD-10-CM

## 2013-03-06 DIAGNOSIS — Z8719 Personal history of other diseases of the digestive system: Secondary | ICD-10-CM

## 2013-03-06 LAB — CBC WITH DIFFERENTIAL/PLATELET
Basophils Relative: 0.2 % (ref 0.0–3.0)
Eosinophils Relative: 0.5 % (ref 0.0–5.0)
Hemoglobin: 7.1 g/dL — CL (ref 13.0–17.0)
Lymphocytes Relative: 22 % (ref 12.0–46.0)
MCHC: 33.1 g/dL (ref 30.0–36.0)
MCV: 92.2 fl (ref 78.0–100.0)
Neutro Abs: 4.3 10*3/uL (ref 1.4–7.7)
Neutrophils Relative %: 73 % (ref 43.0–77.0)
RBC: 2.32 Mil/uL — ABNORMAL LOW (ref 4.22–5.81)
WBC: 5.8 10*3/uL (ref 4.5–10.5)

## 2013-03-06 LAB — COMPREHENSIVE METABOLIC PANEL
AST: 47 U/L — ABNORMAL HIGH (ref 0–37)
Albumin: 3.5 g/dL (ref 3.5–5.2)
Alkaline Phosphatase: 800 U/L — ABNORMAL HIGH (ref 39–117)
BUN: 16 mg/dL (ref 6–23)
Calcium: 8.9 mg/dL (ref 8.4–10.5)
Creatinine, Ser: 1 mg/dL (ref 0.4–1.5)
Glucose, Bld: 90 mg/dL (ref 70–99)
Potassium: 4.1 mEq/L (ref 3.5–5.1)

## 2013-03-06 MED ORDER — POLYETHYLENE GLYCOL 3350 17 GM/SCOOP PO POWD: 17.0000 g | Freq: Every day | ORAL | Status: AC | PRN

## 2013-03-06 NOTE — Patient Instructions (Signed)
You do have prostate cancer that has spread to at least the bones. We could do full body scan to take a look at other organs. Blood work today. May try guaifenesin or plain mucinex as needed for mucous congestion - drink with plenty of water. May use miralax as needed for constipation Return in 1 month for follow up.

## 2013-03-06 NOTE — Assessment & Plan Note (Signed)
Anticipate multifactorial including malnutrition, prostate cancer. Continue lasix.

## 2013-03-06 NOTE — Assessment & Plan Note (Addendum)
Majority of visit spent discussing diagnosis of metastatic prostate cancer.  I did discuss (in presence of wife) prostate cancer diagnosis with spread to at least bones as evidenced by severely elevated PSA and xrays in system. I did discuss at this stage of cancer it is noncurative, and did offer referral to oncologist for discussion on pallative radiation therapy or other palliative therapy options. I also offered PET scan to further evaluate other organ involvement. Pt agrees to blood work today, will consider other further evaluation options. A total of 45 minutes were spent face-to-face with the patient during this encounter and over half of that time was spent on counseling and coordination of care rtc 1 mo for f/u.

## 2013-03-06 NOTE — Assessment & Plan Note (Signed)
Likely tramadol related. Recommended trial of miralax powder to use PRN.

## 2013-03-06 NOTE — Assessment & Plan Note (Signed)
Anticipate directly related to metastatic prostate cancer. Continue megace.

## 2013-03-06 NOTE — Assessment & Plan Note (Signed)
Self catheterizes 4-5 times daily. Advised indwelling catheter not ideal 2/2 increased risk of infection.

## 2013-03-06 NOTE — Progress Notes (Signed)
Subjective:    Patient ID: Zachary Mcguire, male    DOB: [birth year], 72 y.o.   MRN: [id number]  HPI CC: new pt to establish, medicare  Prior PCP - Dr. Arvella [facility] - Dr. Myrtie Neither Records requested.  Wheelchair bound - generalized weakness, nonambulatory for last 6 months. H/o remote R hip fx - resultant leg length discrepancy.  Prostate issues since 1992, seems like metastatic prostate cancer was diagnosed around 2012.  Evidence of spread at least to bones on xrays in system (diffusely bilateral femur, hips, lumbar and thoracic spine).  Has declined eval in past.  Able to void on his own, but endorses incomplete emptying.  Self catheterizes 4-5 times daily. Asks about indwelling foley cath. Uses I/O cath Cure brand, 12 french - uses [employer] [facility] [employer] [facility]. "I don't believe I have prostate cancer". Has declined palliative radiation therapy in past.  Takes megace for appetite. Takes tramadol prn bilateral (R>L) hip pain.  Endorses some constipation issues.  No recent blood work.  Lives with wife, also with daughter and 2 grandchildren. Has 2 daughters Occupation: Chemical engineer and custodian Edu: [employer]  Lab Results  Component Value Date   PSA 3783.00* 07/31/2012   Medications and allergies reviewed and updated in chart.  Past histories reviewed and updated if relevant as below. Patient Active Problem List   Diagnosis Date Noted  . Prostate cancer metastatic to multiple sites   . HLD (hyperlipidemia)   . H/O gastric ulcer   . Urine incontinence   . Asthma   . Constipation   . Leg length discrepancy 08/04/2012   Past Medical History  Diagnosis Date  . Prostate cancer metastatic to multiple sites   . HLD (hyperlipidemia)   . H/O gastric ulcer   . Urine incontinence     self catheterizes  . Hx: UTI (urinary tract infection)   . H/O prostatitis   . History of thrush   . RMSF [facility] spotted fever) 2012    history  . Asthma   .  Constipation    Past Surgical History  Procedure Laterality Date  . Anal fissure repair     History  Substance Use Topics  . Smoking status: Never Smoker   . Smokeless tobacco: Never Used  . Alcohol Use: No   Family History  Problem Relation Age of Onset  . Arthritis Sister   . Cancer Neg Hx   . CAD Neg Hx   . Stroke Neg Hx   . Diabetes Neg Hx    Allergies  Allergen Reactions  . Voltaren (Diclofenac Sodium) Other (See Comments)    hallucinations   Current Outpatient Prescriptions on File Prior to Visit  Medication Sig Dispense Refill  . ALPRAZolam (XANAX) 0.5 MG tablet Take 1 tablet (0.5 mg total) by mouth at bedtime as needed.  30 tablet  5  . megestrol (MEGACE) 40 MG/ML suspension Take 5 mLs (200 mg total) by mouth daily.  240 mL  5   No current facility-administered medications on file prior to visit.    Review of Systems Per HPI    Objective:   Physical Exam  Nursing note and vitals reviewed. Constitutional: He is oriented to person, place, and time. He appears well-developed and well-nourished. No distress.  Diffusely weak, ill appearing, in wheelchair  HENT:  Head: Normocephalic and atraumatic.  Right Ear: Hearing, tympanic membrane, external ear and ear canal normal.  Left Ear: Hearing, tympanic membrane, external ear and ear canal normal.  Nose:  Nose normal.  Mouth/Throat: Uvula is midline, oropharynx is clear and moist and mucous membranes are normal. No oropharyngeal exudate, posterior oropharyngeal edema, posterior oropharyngeal erythema or tonsillar abscesses.  Eyes: Conjunctivae and EOM are normal. Pupils are equal, round, and reactive to light. No scleral icterus.  Neck: Normal range of motion. Neck supple.  Cardiovascular: Normal rate, regular rhythm, normal heart sounds and intact distal pulses.   No murmur heard. Pulses:      Radial pulses are 2+ on the right side, and 2+ on the left side.  Pulmonary/Chest: Effort normal and breath sounds normal.  No respiratory distress. He has no wheezes. He has no rales.  Abdominal: Soft. Bowel sounds are normal. He exhibits no distension and no mass. There is no tenderness. There is no rebound and no guarding.  Musculoskeletal: Normal range of motion. He exhibits edema (LLE 2+ pitting, RLE tr-1+).  Lymphadenopathy:    He has no cervical adenopathy.  Neurological: He is alert and oriented to person, place, and time.  CN grossly intact, station and gait intact  Skin: Skin is warm and dry. No rash noted.  Psychiatric: He has a normal mood and affect. His behavior is normal. Judgment and thought content normal.       Assessment & Plan:

## 2013-03-07 ENCOUNTER — Telehealth: Payer: Self-pay | Admitting: Family Medicine

## 2013-03-07 ENCOUNTER — Other Ambulatory Visit: Payer: Self-pay | Admitting: Family Medicine

## 2013-03-07 DIAGNOSIS — D649 Anemia, unspecified: Secondary | ICD-10-CM

## 2013-03-07 DIAGNOSIS — C61 Malignant neoplasm of prostate: Secondary | ICD-10-CM

## 2013-03-07 MED ORDER — MEGESTROL ACETATE 40 MG/ML PO SUSP: 200.0000 mg | Freq: Every day | ORAL | Status: DC

## 2013-03-07 NOTE — Telephone Encounter (Signed)
Patient notified. He said he has been dizzy when he gets up, but no other symptoms.

## 2013-03-07 NOTE — Telephone Encounter (Signed)
plz notify patient- preliminary blood work shows he has a very low blood count - very anemic. This could explain his weakness he's had worsening over the last several months and could be coming from his prostate cancer. Is he having dizziness, chest pain, shortness of breath? Has he noticed any bleeding in stool or elsewhere?

## 2013-03-07 NOTE — Telephone Encounter (Signed)
Mrs. Cumpton left v/m requesting refill megace to [facility] [address].Please advise.Mrs. Webb request cb when med sent in.

## 2013-03-07 NOTE — Telephone Encounter (Signed)
Patients wife notified

## 2013-03-08 DIAGNOSIS — D6182 Myelophthisis: Secondary | ICD-10-CM | POA: Insufficient documentation

## 2013-03-08 HISTORY — DX: Myelophthisis: D61.82

## 2013-03-08 NOTE — Telephone Encounter (Signed)
plz notify - I can offer blood transfusion which may make him feel better from dizziness standpoint, but we're not treating cause of anemia - which I expect is from spread of prostate cancer into bone. For this I would recommend referral to cancer doctor to discuss treatment options.  But up to patient to decide if he desires this. I'd also like him to come for stool kit to ensure no blood in stool.

## 2013-03-09 NOTE — Telephone Encounter (Signed)
Patient notified. He wants to see how he feels after taking molasses and tonic (old remedy for anemia). He will discuss things further with you at his follow up. Stool kit mailed to patient at his request.

## 2013-03-12 NOTE — Telephone Encounter (Signed)
Noted. I'd like him to start taking iron supplement once he submits stool kit. Also - he had swollen left leg on visit here last week - if this is recently new issue, I'd suggest ultrasound of left leg to rule out blood clot as cancer increases risk of this.  If this has been present for several months, we may reassess at f/u visit.

## 2013-03-13 ENCOUNTER — Inpatient Hospital Stay (HOSPITAL_COMMUNITY): Payer: Medicare Other

## 2013-03-13 ENCOUNTER — Inpatient Hospital Stay (HOSPITAL_COMMUNITY)
Admission: EM | Admit: 2013-03-13 | Discharge: 2013-03-16 | DRG: 292 | Disposition: A | Discharge status: Home / Self Care | Payer: Medicare Other | Attending: Internal Medicine | Admitting: Internal Medicine

## 2013-03-13 ENCOUNTER — Encounter (HOSPITAL_COMMUNITY): Payer: Self-pay | Admitting: Neurology

## 2013-03-13 ENCOUNTER — Emergency Department (HOSPITAL_COMMUNITY): Payer: Medicare Other

## 2013-03-13 DIAGNOSIS — R32 Unspecified urinary incontinence: Secondary | ICD-10-CM

## 2013-03-13 DIAGNOSIS — J45909 Unspecified asthma, uncomplicated: Secondary | ICD-10-CM | POA: Diagnosis present

## 2013-03-13 DIAGNOSIS — L97409 Non-pressure chronic ulcer of unspecified heel and midfoot with unspecified severity: Secondary | ICD-10-CM | POA: Diagnosis present

## 2013-03-13 DIAGNOSIS — D630 Anemia in neoplastic disease: Secondary | ICD-10-CM | POA: Diagnosis present

## 2013-03-13 DIAGNOSIS — N390 Urinary tract infection, site not specified: Secondary | ICD-10-CM | POA: Diagnosis present

## 2013-03-13 DIAGNOSIS — Z66 Do not resuscitate: Secondary | ICD-10-CM | POA: Diagnosis present

## 2013-03-13 DIAGNOSIS — D6182 Myelophthisis: Secondary | ICD-10-CM | POA: Diagnosis present

## 2013-03-13 DIAGNOSIS — R339 Retention of urine, unspecified: Secondary | ICD-10-CM | POA: Diagnosis present

## 2013-03-13 DIAGNOSIS — IMO0002 Reserved for concepts with insufficient information to code with codable children: Secondary | ICD-10-CM

## 2013-03-13 DIAGNOSIS — R0600 Dyspnea, unspecified: Secondary | ICD-10-CM | POA: Diagnosis present

## 2013-03-13 DIAGNOSIS — D649 Anemia, unspecified: Secondary | ICD-10-CM | POA: Diagnosis present

## 2013-03-13 DIAGNOSIS — G4733 Obstructive sleep apnea (adult) (pediatric): Secondary | ICD-10-CM

## 2013-03-13 DIAGNOSIS — K5900 Constipation, unspecified: Secondary | ICD-10-CM

## 2013-03-13 DIAGNOSIS — Z8719 Personal history of other diseases of the digestive system: Secondary | ICD-10-CM

## 2013-03-13 DIAGNOSIS — C61 Malignant neoplasm of prostate: Secondary | ICD-10-CM | POA: Diagnosis present

## 2013-03-13 DIAGNOSIS — A498 Other bacterial infections of unspecified site: Secondary | ICD-10-CM | POA: Diagnosis present

## 2013-03-13 DIAGNOSIS — R29898 Other symptoms and signs involving the musculoskeletal system: Secondary | ICD-10-CM | POA: Diagnosis present

## 2013-03-13 DIAGNOSIS — E785 Hyperlipidemia, unspecified: Secondary | ICD-10-CM

## 2013-03-13 DIAGNOSIS — M2170 Unequal limb length (acquired), unspecified site: Secondary | ICD-10-CM

## 2013-03-13 DIAGNOSIS — K5641 Fecal impaction: Secondary | ICD-10-CM | POA: Diagnosis present

## 2013-03-13 DIAGNOSIS — I359 Nonrheumatic aortic valve disorder, unspecified: Secondary | ICD-10-CM

## 2013-03-13 DIAGNOSIS — R0609 Other forms of dyspnea: Secondary | ICD-10-CM

## 2013-03-13 DIAGNOSIS — E46 Unspecified protein-calorie malnutrition: Secondary | ICD-10-CM | POA: Diagnosis present

## 2013-03-13 DIAGNOSIS — I509 Heart failure, unspecified: Principal | ICD-10-CM | POA: Diagnosis present

## 2013-03-13 DIAGNOSIS — R627 Adult failure to thrive: Secondary | ICD-10-CM | POA: Diagnosis present

## 2013-03-13 DIAGNOSIS — C7951 Secondary malignant neoplasm of bone: Secondary | ICD-10-CM | POA: Diagnosis present

## 2013-03-13 DIAGNOSIS — R600 Localized edema: Secondary | ICD-10-CM

## 2013-03-13 DIAGNOSIS — C7952 Secondary malignant neoplasm of bone marrow: Secondary | ICD-10-CM | POA: Diagnosis present

## 2013-03-13 LAB — COMPREHENSIVE METABOLIC PANEL
AST: 67 U/L — ABNORMAL HIGH (ref 0–37)
BUN: 16 mg/dL (ref 6–23)
CO2: 23 mEq/L (ref 19–32)
Chloride: 99 mEq/L (ref 96–112)
Creatinine, Ser: 0.93 mg/dL (ref 0.50–1.35)
GFR calc Af Amer: >90 mL/min (ref >90)
GFR calc non Af Amer: 82 mL/min — ABNORMAL LOW (ref >90)
Glucose, Bld: 125 mg/dL — ABNORMAL HIGH (ref 70–99)
Total Bilirubin: 0.2 mg/dL — ABNORMAL LOW (ref 0.3–1.2)

## 2013-03-13 LAB — CBC WITH DIFFERENTIAL/PLATELET
Basophils Relative: 0 % (ref 0–1)
Eosinophils Relative: 0 % (ref 0–5)
HCT: 19.9 % — ABNORMAL LOW (ref 39.0–52.0)
Lymphs Abs: 0.9 10*3/uL (ref 0.7–4.0)
MCH: 28.9 pg (ref 26.0–34.0)
MCV: 88.4 fL (ref 78.0–100.0)
Monocytes Absolute: 0.3 10*3/uL (ref 0.1–1.0)
Monocytes Relative: 6 % (ref 3–12)
Neutro Abs: 4.5 10*3/uL (ref 1.7–7.7)
RBC: 2.25 MIL/uL — ABNORMAL LOW (ref 4.22–5.81)
WBC: 5.7 10*3/uL (ref 4.0–10.5)

## 2013-03-13 LAB — URINALYSIS, ROUTINE W REFLEX MICROSCOPIC
Bilirubin Urine: NEGATIVE
Ketones, ur: NEGATIVE mg/dL
Specific Gravity, Urine: 1.029 (ref 1.005–1.030)
Urobilinogen, UA: 0.2 mg/dL (ref 0.0–1.0)

## 2013-03-13 LAB — URINE MICROSCOPIC-ADD ON

## 2013-03-13 LAB — PROTIME-INR
INR: 1.7 — ABNORMAL HIGH (ref 0.00–1.49)
Prothrombin Time: 19.5 seconds — ABNORMAL HIGH (ref 11.6–15.2)

## 2013-03-13 LAB — PREPARE RBC (CROSSMATCH)

## 2013-03-13 LAB — PRO B NATRIURETIC PEPTIDE: Pro B Natriuretic peptide (BNP): 889.4 pg/mL — ABNORMAL HIGH (ref 0–125)

## 2013-03-13 LAB — OCCULT BLOOD, POC DEVICE: Fecal Occult Bld: POSITIVE — AB

## 2013-03-13 LAB — ABO/RH: ABO/RH(D): B POS

## 2013-03-13 LAB — D-DIMER, QUANTITATIVE: D-Dimer, Quant: >20 ug/mL-FEU — ABNORMAL HIGH (ref 0.00–0.48)

## 2013-03-13 MED ORDER — ACETAMINOPHEN 325 MG PO TABS: 650.0000 mg | ORAL_TABLET | Freq: Four times a day (QID) | ORAL | Status: DC | PRN

## 2013-03-13 MED ORDER — ONDANSETRON HCL 4 MG/2ML IJ SOLN: 4.0000 mg | Freq: Four times a day (QID) | INTRAMUSCULAR | Status: DC | PRN

## 2013-03-13 MED ORDER — FUROSEMIDE 10 MG/ML IJ SOLN
40.0000 mg | Freq: Once | INTRAMUSCULAR | Status: AC
  Administered 2013-03-13: 40 mg via INTRAVENOUS
  Filled 2013-03-13: qty 4

## 2013-03-13 MED ORDER — TRAMADOL HCL 50 MG PO TABS: 50.0000 mg | ORAL_TABLET | Freq: Two times a day (BID) | ORAL | Status: DC | PRN

## 2013-03-13 MED ORDER — ALPRAZOLAM 0.5 MG PO TABS: 0.5000 mg | ORAL_TABLET | Freq: Two times a day (BID) | ORAL | Status: DC | PRN

## 2013-03-13 MED ORDER — FERROUS SULFATE 325 (65 FE) MG PO TABS
325.0000 mg | ORAL_TABLET | Freq: Every day | ORAL | Status: DC
  Administered 2013-03-13 – 2013-03-16 (×4): 325 mg via ORAL
  Filled 2013-03-13 (×4): qty 1

## 2013-03-13 MED ORDER — POLYETHYLENE GLYCOL 3350 17 GM/SCOOP PO POWD
17.0000 g | Freq: Two times a day (BID) | ORAL | Status: DC | PRN
  Filled 2013-03-13: qty 255

## 2013-03-13 MED ORDER — NYSTATIN 100000 UNIT/ML MT SUSP
5.0000 mL | Freq: Three times a day (TID) | OROMUCOSAL | Status: DC | PRN
  Administered 2013-03-13 – 2013-03-15 (×3): 500000 [IU] via ORAL
  Filled 2013-03-13 (×5): qty 5

## 2013-03-13 MED ORDER — BISACODYL 10 MG RE SUPP
10.0000 mg | Freq: Every day | RECTAL | Status: DC | PRN
  Administered 2013-03-13 – 2013-03-15 (×3): 10 mg via RECTAL
  Filled 2013-03-13 (×3): qty 1

## 2013-03-13 MED ORDER — ENOXAPARIN SODIUM 40 MG/0.4ML ~~LOC~~ SOLN
40.0000 mg | SUBCUTANEOUS | Status: DC
  Administered 2013-03-13 – 2013-03-16 (×4): 40 mg via SUBCUTANEOUS
  Filled 2013-03-13 (×4): qty 0.4

## 2013-03-13 MED ORDER — DEXTROSE 5 % IV SOLN
1.0000 g | INTRAVENOUS | Status: DC
  Administered 2013-03-14 – 2013-03-16 (×3): 1 g via INTRAVENOUS
  Filled 2013-03-13 (×3): qty 10

## 2013-03-13 MED ORDER — POLYETHYLENE GLYCOL 3350 17 G PO PACK
17.0000 g | PACK | Freq: Two times a day (BID) | ORAL | Status: DC | PRN
  Administered 2013-03-13 – 2013-03-14 (×3): 17 g via ORAL
  Filled 2013-03-13 (×4): qty 1

## 2013-03-13 MED ORDER — DEXTROSE 5 % IV SOLN
1.0000 g | Freq: Once | INTRAVENOUS | Status: AC
  Administered 2013-03-13: 1 g via INTRAVENOUS
  Filled 2013-03-13: qty 10

## 2013-03-13 MED ORDER — IOHEXOL 350 MG/ML SOLN
100.0000 mL | Freq: Once | INTRAVENOUS | Status: AC | PRN
  Administered 2013-03-13: 60 mL via INTRAVENOUS

## 2013-03-13 MED ORDER — SENNOSIDES-DOCUSATE SODIUM 8.6-50 MG PO TABS
1.0000 | ORAL_TABLET | Freq: Two times a day (BID) | ORAL | Status: DC
  Administered 2013-03-13 – 2013-03-16 (×7): 1 via ORAL
  Filled 2013-03-13 (×8): qty 1

## 2013-03-13 MED ORDER — TRAMADOL HCL ER 100 MG PO TB24: 100.0000 mg | ORAL_TABLET | Freq: Every day | ORAL | Status: DC | PRN

## 2013-03-13 MED ORDER — DEXTROSE 5 % IV SOLN
1.0000 g | INTRAVENOUS | Status: DC
  Filled 2013-03-13: qty 10

## 2013-03-13 MED ORDER — FUROSEMIDE 10 MG/ML IJ SOLN
40.0000 mg | Freq: Two times a day (BID) | INTRAMUSCULAR | Status: DC
  Administered 2013-03-13 – 2013-03-15 (×4): 40 mg via INTRAVENOUS
  Filled 2013-03-13 (×6): qty 4

## 2013-03-13 MED ORDER — POTASSIUM CHLORIDE CRYS ER 20 MEQ PO TBCR
40.0000 meq | EXTENDED_RELEASE_TABLET | Freq: Every day | ORAL | Status: DC
  Administered 2013-03-13 – 2013-03-16 (×4): 40 meq via ORAL
  Filled 2013-03-13: qty 1
  Filled 2013-03-13 (×4): qty 2

## 2013-03-13 NOTE — H&P (Addendum)
[facility] History and Physical  Zachary Mcguire UJW:[id number] DOB: [birth year] DOA: 03/13/2013  Referring physician: EDP PCP: Eustaquio Boyden, MD   Chief Complaint: Dyspnea  HPI: Zachary Mcguire is a 72 y.o. 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 He has never had any treatment for his Prostate CA for the last 10-15 years.  He has had several medical problems in the last year, progressive lower leg weakness, pain in his Leg especially R thigh/hip. He presents to the ER today with dyspnea on exertion since yesterday. He has also had lower extremity edema for 3-6 months. Upon evaluation in there ER, he was noted to have Hb of 6.5, down from 7.2gm/dl 10 days ago. No hematemesis, melena, hematochezia.     Review of Systems: The patient denies anorexia, fever, weight loss,, vision loss, decreased hearing, hoarseness, chest pain, syncope,  peripheral edema, balance deficits, hemoptysis, abdominal pain, melena, hematochezia, severe indigestion/heartburn, hematuria, incontinence, genital sores, muscle weakness, suspicious skin lesions, transient blindness, difficulty walking, depression, unusual weight change, abnormal bleeding, enlarged lymph nodes, angioedema, and breast masses.    Past Medical History  Diagnosis Date  . Prostate cancer metastatic to multiple sites   . HLD (hyperlipidemia)   . H/O gastric ulcer   . Urine incontinence     self catheterizes  . Hx: UTI (urinary tract infection)   . H/O prostatitis   . History of thrush   . RMSF [facility] spotted fever) 2012    history  . Asthma   . Constipation   . Obstructive sleep apnea 2012    sleep study showing mod to severe OSA with AHI 47   Past Surgical History  Procedure Laterality Date  . Anal fissure repair     Social History:  reports that he has never smoked. He has never used smokeless tobacco. He reports that he does not drink alcohol or use illicit drugs. Lives at  home with spouse, uses stretcher/whel chair to ambulate  Allergies  Allergen Reactions  . Voltaren (Diclofenac Sodium) Other (See Comments)    hallucinations    Family History  Problem Relation Age of Onset  . Arthritis Sister   . Cancer Neg Hx   . CAD Neg Hx   . Stroke Neg Hx   . Diabetes Neg Hx     Prior to Admission medications   Medication Sig Start Date End Date Taking? Authorizing Provider  ALPRAZolam Prudy Feeler) 0.5 MG tablet Take 0.5 mg by mouth See admin instructions. Take 1 tablet every morning scheduled and take 1 tablet every evening as needed for restless legs and anxiety   Yes Historical Provider, MD  ferrous sulfate 325 (65 FE) MG tablet Take 325 mg by mouth daily.   Yes Historical Provider, MD  furosemide (LASIX) 20 MG tablet Take 20 mg by mouth 2 (two) times daily.    Yes Historical Provider, MD  Iron-Vitamins (S.S.S. TONIC) LIQD Take 30 mLs by mouth daily.   Yes Historical Provider, MD  ketoconazole (NIZORAL) 2 % cream Apply 1 application topically daily as needed (for itching).   Yes Historical Provider, MD  megestrol (MEGACE) 40 MG/ML suspension Take 5 mLs (200 mg total) by mouth daily. 03/07/13  Yes Eustaquio Boyden, MD  OVER THE COUNTER MEDICATION Take 15 mLs by mouth daily. Black strait (iron supplement)   Yes Historical Provider, MD  polyethylene glycol powder (GLYCOLAX/MIRALAX) powder Take 17 g by mouth daily as needed (constipation). 03/06/13  Yes Eustaquio Boyden, MD  tetrahydrozoline (  VISINE) 0.05 % ophthalmic solution Place 1 drop into both eyes 2 (two) times daily as needed (for allergies).   Yes Historical Provider, MD  traMADol (ULTRAM-ER) 100 MG 24 hr tablet Take 100 mg by mouth daily as needed for pain.   Yes Historical Provider, MD   Physical Exam: Filed Vitals:   03/13/13 0745 03/13/13 1029 03/13/13 1135 03/13/13 1212  BP: 150/70 152/62 162/65 152/69  Pulse: 110 106 109 108  Temp:   98.3 F (36.8 C) 98.1 F (36.7 C)  TempSrc:    Oral  Resp:  16 18  19   Height:   5\' 10"  (1.778 m)   Weight:   71.351 kg (157 lb 4.8 oz)   SpO2: 100% 100% 100%      General: AAOx3  HEENT PERRLA, EOMI  CVS S1-S2 regular rate rhythm  Lungs faint or crackles  Abdomen soft nontender mildly distended positive bowel sounds are organomegaly  Extremities 2+ edema pitting  Neuro : Chronic bilateral lower extremity weakness 2/5    Labs on Admission:  Basic Metabolic Panel:  Recent Labs Lab 03/13/13 0820  NA 135  K 3.7  CL 99  CO2 23  GLUCOSE 125*  BUN 16  CREATININE 0.93  CALCIUM 9.3   Liver Function Tests:  Recent Labs Lab 03/13/13 0820  AST 67*  ALT 15  ALKPHOS 882*  BILITOT 0.2*  PROT 6.6  ALBUMIN 2.9*   No results found for this basename: LIPASE, AMYLASE,  in the last 168 hours No results found for this basename: AMMONIA,  in the last 168 hours CBC:  Recent Labs Lab 03/13/13 0820  WBC 5.7  NEUTROABS 4.5  HGB 6.5*  HCT 19.9*  MCV 88.4  PLT 135*   Cardiac Enzymes:  Recent Labs Lab 03/13/13 0821  TROPONINI <0.30    BNP (last 3 results)  Recent Labs  03/13/13 0821  PROBNP 889.4*   CBG: No results found for this basename: GLUCAP,  in the last 168 hours  Radiological Exams on Admission: Dg Chest 2 View  03/13/2013   *RADIOLOGY REPORT*  Clinical Data: Shortness of breath.   Prostate cancer.  CHEST - 2 VIEW  Comparison: 02/18/2012  Findings: Extensive sclerotic osseous metastasis.  Progressive since 02/18/2012.  Midline trachea.  Normal heart size.  Aortic atherosclerosis. No pleural effusion or pneumothorax.  Clear lungs.  IMPRESSION: Cardiomegaly, without acute superimposed cardiopulmonary disease.  Extensive osseous metastasis.  Progressive since 02/18/2012.   Original Report Authenticated By: Jeronimo Greaves, M.D.    EKG: Independently reviewed. NSR, no acute St T wave changes  Assessment/Plan Active Problems:   Prostate cancer metastatic to multiple sites   Constipation   Failure to thrive   Anemia    Dyspnea   Malignant neoplasm of prostate   1. Dyspnea -possibly related to CHF/anemia -Significantly elevated D dimer with CA, CTA chest to r/o PE. -Transfuse 2 units PRBC -Diurese with IV lasix 40mg  Q12 -check 2D ECHO  2. Anemia  -acute on chronic, transfuse 2 units PRBC -most likely related to metastatic Prostate CA -Heme check in ER with trace heme positive brown stool -check anemia panel, do not think he needs GI workup unless overt blood loss -PPI  3. Prostate Cancer with extensive bony metastasis - Has declined chemotherapy and XRT in the past - Discussed patient's family about hospice and palliative care, wife wants to hold off on this acutely - oxycodone prn for chronic bony pain - Significant chronic bilateral lower extremity weakness for over 1  year   4. possible UTI Continue ceftriaxone Follow up urine cultures  5. Constipation/impaction - Trial of Senokot/MiraLAX/Dulcolax suppository -will need enema if unresolved  Code Status: DNR Family Communication: dw pt, wife and daughter at bedside Disposition Plan: Telemetry  Time spent:  Renelda Kilian [facility] Pager [phone]  If 7PM-7AM, please contact night-coverage [email or web address] Password [facility] 03/13/2013, 12:38 PM   The

## 2013-03-13 NOTE — ED Notes (Addendum)
Pt reporting difficulty breathing while laying flat last night and was sore all over. Pt has swelling to lower extremities worse over past few days. Pt in no respiratory distress, breathing unlabored. Reports excess mucus lately.

## 2013-03-13 NOTE — Consult Note (Signed)
WOC consult Note Reason for Consult: Consult requested for left heel wound.  Pt states he developed it several weeks ago from sleeping in a chair and the pressure from the floor caused the wound to develop. Wound type: Unstageable Pressure Ulcer POA: Yes Measurement:3X4cm Wound bed: 100% dry eschar Drainage (amount, consistency, odor) No odor or drainage. Periwound: Generalized edema to left leg and foot.  Crackles when palpated as when subcutaneous edema is felt. Very painful to touch. Dressing procedure/placement/frequency: Discussed plan of care with primary team.  Best practice for dry stable unstageable heel ulcers is to leave intact; no topical care required at this time.  Reduce pressure to site with Prevalon heel lift boot.  This should not be worn when pt is ambulating.  Discussed with patient if wound begins draining or having an odor, then he should notify physician. Please re-consult if further assistance is needed.  Thank-you,  Cammie Mcgee MSN, RN, CWOCN, [place], CNS [phone]

## 2013-03-13 NOTE — Progress Notes (Signed)
Utilization review completed.  

## 2013-03-13 NOTE — ED Provider Notes (Signed)
History    CSN: [id number] Arrival date & time 03/13/13  0705  First MD Initiated Contact with Patient 03/13/13 [phone]     Chief Complaint  Patient presents with  . Generalized Body Aches   (Consider location/radiation/quality/duration/timing/severity/associated sxs/prior Treatment) HPI Pt with history of metastatic prostate CA recently seen by PMD for fatigue and generalized weakness. HGB was 7. Pt offered transfusion but wanted to try molasses and tonic. Denies dark or bloody stools. Pt states he woke last night with increased work of breathing and generalized myalgia. No cough, fever, chest pain. Pt has several months of bl lower ext swelling.  Past Medical History  Diagnosis Date  . Prostate cancer metastatic to multiple sites   . HLD (hyperlipidemia)   . H/O gastric ulcer   . Urine incontinence     self catheterizes  . Hx: UTI (urinary tract infection)   . H/O prostatitis   . History of thrush   . RMSF [facility] spotted fever) 2012    history  . Asthma   . Constipation   . Obstructive sleep apnea 2012    sleep study showing mod to severe OSA with AHI 47   Past Surgical History  Procedure Laterality Date  . Anal fissure repair     Family History  Problem Relation Age of Onset  . Arthritis Sister   . Cancer Neg Hx   . CAD Neg Hx   . Stroke Neg Hx   . Diabetes Neg Hx    History  Substance Use Topics  . Smoking status: Never Smoker   . Smokeless tobacco: Never Used  . Alcohol Use: No    Review of Systems  Constitutional: Positive for fatigue. Negative for fever and chills.  Respiratory: Positive for shortness of breath. Negative for cough, chest tightness and wheezing.   Cardiovascular: Positive for leg swelling. Negative for chest pain and palpitations.  Gastrointestinal: Positive for constipation. Negative for nausea, vomiting, abdominal pain and blood in stool.  Genitourinary: Negative for dysuria and frequency.  Musculoskeletal: Positive for myalgias  and arthralgias.  Skin: Negative for rash and wound.  Neurological: Positive for weakness. Negative for dizziness, syncope, light-headedness, numbness and headaches.  All other systems reviewed and are negative.    Allergies  Voltaren  Home Medications   Current Outpatient Rx  Name  Route  Sig  Dispense  Refill  . ALPRAZolam (XANAX) 0.5 MG tablet   Oral   Take 0.5 mg by mouth See admin instructions. Take 1 tablet every morning scheduled and take 1 tablet every evening as needed for restless legs and anxiety         . ferrous sulfate 325 (65 FE) MG tablet   Oral   Take 325 mg by mouth daily.         . furosemide (LASIX) 20 MG tablet   Oral   Take 20 mg by mouth 2 (two) times daily.          . Iron-Vitamins (S.S.S. TONIC) LIQD   Oral   Take 30 mLs by mouth daily.         Marland Kitchen ketoconazole (NIZORAL) 2 % cream   Topical   Apply 1 application topically daily as needed (for itching).         . megestrol (MEGACE) 40 MG/ML suspension   Oral   Take 5 mLs (200 mg total) by mouth daily.   240 mL   5   . OVER THE COUNTER MEDICATION   Oral  Take 15 mLs by mouth daily. Black strait (iron supplement)         . polyethylene glycol powder (GLYCOLAX/MIRALAX) powder   Oral   Take 17 g by mouth daily as needed (constipation).   3350 g   1   . tetrahydrozoline (VISINE) 0.05 % ophthalmic solution   Both Eyes   Place 1 drop into both eyes 2 (two) times daily as needed (for allergies).         . traMADol (ULTRAM-ER) 100 MG 24 hr tablet   Oral   Take 100 mg by mouth daily as needed for pain.          BP 150/70  Pulse 110  Temp(Src) 98.9 F (37.2 C) (Oral)  SpO2 100% Physical Exam  Nursing note and vitals reviewed. Constitutional: He is oriented to person, place, and time. He appears well-developed and well-nourished. No distress.  HENT:  Head: Normocephalic and atraumatic.  Mouth/Throat: Oropharynx is clear and moist.  Eyes: EOM are normal. Pupils are equal,  round, and reactive to light.  Neck: Normal range of motion. Neck supple.  Cardiovascular: Regular rhythm.   Murmur heard. tachycardia  Pulmonary/Chest: Effort normal and breath sounds normal. No respiratory distress. He has no wheezes. He has no rales. He exhibits no tenderness.  Abdominal: Soft. Bowel sounds are normal. He exhibits no distension and no mass. There is no tenderness. There is no rebound and no guarding.  Genitourinary:  Firm, enlarged and irregular prostate. No impacted stool in rectum. +brown soft stool  Musculoskeletal: Normal range of motion. He exhibits edema (3+ bl lower ext edema). He exhibits no tenderness.  No calf tenderness  Neurological: He is alert and oriented to person, place, and time.  5/5 motor in all ext, sensation intact  Skin: Skin is warm and dry. No rash noted. No erythema.  Psychiatric: He has a normal mood and affect. His behavior is normal.    ED Course  Procedures (including critical care time) Labs Reviewed  CBC WITH DIFFERENTIAL - Abnormal; Notable for the following:    RBC 2.25 (*)    Hemoglobin 6.5 (*)    HCT 19.9 (*)    RDW 18.8 (*)    Platelets 135 (*)    Neutrophils Relative % 78 (*)    All other components within normal limits  COMPREHENSIVE METABOLIC PANEL - Abnormal; Notable for the following:    Glucose, Bld 125 (*)    Albumin 2.9 (*)    AST 67 (*)    Alkaline Phosphatase 882 (*)    Total Bilirubin 0.2 (*)    GFR calc non Af Amer 82 (*)    All other components within normal limits  PROTIME-INR - Abnormal; Notable for the following:    Prothrombin Time 19.5 (*)    INR 1.70 (*)    All other components within normal limits  URINALYSIS, ROUTINE W REFLEX MICROSCOPIC - Abnormal; Notable for the following:    APPearance CLOUDY (*)    Hgb urine dipstick LARGE (*)    Protein, ur 100 (*)    Leukocytes, UA MODERATE (*)    All other components within normal limits  PRO B NATRIURETIC PEPTIDE - Abnormal; Notable for the following:     Pro B Natriuretic peptide (BNP) 889.4 (*)    All other components within normal limits  URINE MICROSCOPIC-ADD ON - Abnormal; Notable for the following:    Bacteria, UA MANY (*)    Casts GRANULAR CAST (*)    All other  components within normal limits  OCCULT BLOOD, POC DEVICE - Abnormal; Notable for the following:    Fecal Occult Bld POSITIVE (*)    All other components within normal limits  URINE CULTURE  TROPONIN I  APTT  D-DIMER, QUANTITATIVE  OCCULT BLOOD X 1 CARD TO LAB, STOOL  TYPE AND SCREEN  ABO/RH  PREPARE RBC (CROSSMATCH)   Dg Chest 2 View  03/13/2013   *RADIOLOGY REPORT*  Clinical Data: Shortness of breath.   Prostate cancer.  CHEST - 2 VIEW  Comparison: 02/18/2012  Findings: Extensive sclerotic osseous metastasis.  Progressive since 02/18/2012.  Midline trachea.  Normal heart size.  Aortic atherosclerosis. No pleural effusion or pneumothorax.  Clear lungs.  IMPRESSION: Cardiomegaly, without acute superimposed cardiopulmonary disease.  Extensive osseous metastasis.  Progressive since 02/18/2012.   Original Report Authenticated By: Jeronimo Greaves, M.D.   1. Anemia   2. UTI (urinary tract infection)   3. Prostate cancer metastatic to multiple sites     Date: 03/13/2013  Rate: 101  Rhythm: sinus tachycardia  QRS Axis: normal  Intervals: normal  ST/T Wave abnormalities: normal  Conduction Disutrbances:none  Narrative Interpretation:   Old EKG Reviewed: changes noted   MDM  Discussed with Dr Jomarie Longs. Will admit to tele bed.   Loren Racer, MD 03/13/13 [phone]

## 2013-03-13 NOTE — ED Notes (Signed)
Informed consent for blood on chart

## 2013-03-13 NOTE — Progress Notes (Signed)
  Echocardiogram 2D Echocardiogram has been performed.  Carolene Gitto FRANCES 03/13/2013, 4:20 PM 

## 2013-03-13 NOTE — ED Notes (Signed)
Lab called with critical hemoglobin of 6.5, EDP notified.

## 2013-03-14 DIAGNOSIS — I509 Heart failure, unspecified: Principal | ICD-10-CM

## 2013-03-14 DIAGNOSIS — N390 Urinary tract infection, site not specified: Secondary | ICD-10-CM

## 2013-03-14 DIAGNOSIS — M7989 Other specified soft tissue disorders: Secondary | ICD-10-CM

## 2013-03-14 LAB — BASIC METABOLIC PANEL
BUN: 16 mg/dL (ref 6–23)
GFR calc non Af Amer: 81 mL/min — ABNORMAL LOW (ref >90)
Glucose, Bld: 112 mg/dL — ABNORMAL HIGH (ref 70–99)
Potassium: 3.5 mEq/L (ref 3.5–5.1)

## 2013-03-14 LAB — CBC
HCT: 25.5 % — ABNORMAL LOW (ref 39.0–52.0)
Hemoglobin: 8.4 g/dL — ABNORMAL LOW (ref 13.0–17.0)
MCH: 28.4 pg (ref 26.0–34.0)
MCHC: 32.9 g/dL (ref 30.0–36.0)
MCV: 86.1 fL (ref 78.0–100.0)

## 2013-03-14 LAB — TYPE AND SCREEN
Antibody Screen: NEGATIVE
Unit division: 0

## 2013-03-14 LAB — PSA: PSA: 3842 ng/mL — ABNORMAL HIGH (ref <=4.00)

## 2013-03-14 MED ORDER — CALCIUM CARBONATE ANTACID 500 MG PO CHEW
400.0000 mg | CHEWABLE_TABLET | Freq: Three times a day (TID) | ORAL | Status: DC | PRN
Start: 2013-03-14 — End: 2013-03-16
  Administered 2013-03-14: 400 mg via ORAL
  Filled 2013-03-14 (×2): qty 2

## 2013-03-14 MED ORDER — MEGESTROL ACETATE 40 MG/ML PO SUSP
200.0000 mg | Freq: Every day | ORAL | Status: DC
  Administered 2013-03-15 – 2013-03-16 (×2): 200 mg via ORAL
  Filled 2013-03-14 (×3): qty 5

## 2013-03-14 NOTE — Telephone Encounter (Signed)
Seen in hospital today.

## 2013-03-14 NOTE — Telephone Encounter (Signed)
Patient is in-patient.

## 2013-03-14 NOTE — Progress Notes (Signed)
[facility] PROGRESS NOTE  Zachary Mcguire AVW:[id number] DOB: [birth year] DOA: 03/13/2013 PCP: Eustaquio Boyden, MD  Assessment/Plan:  1. Dyspnea/ High output CHF, acute-  Secondary to severe anemia -Significantly elevated D dimer with CA, CTA chest >> neg for PE and doppler US neg -s/p Transfuse 2 units PRBC >>hgb 8.4 this am -continue Diuresis with IV lasix 40mg  Q12  - 2D ECHO  With EF 60-65% and no wall motion abnl 2. Anemia  -acute on chronic, transfuse 2 units PRBC  -most likely related to metastatic Prostate CA  -Heme check in ER with trace heme positive brown stool  -check anemia panel, do not think he needs GI workup unless overt blood loss  -PPI  3. Prostate Cancer with extensive bony metastasis  - Has declined chemotherapy and XRT in the past  - Discussed patient's family about hospice and palliative care, wife states today she wants to talk with her family first - oxycodone prn for chronic bony pain  - Significant chronic bilateral lower extremity weakness for over 1 year but L>R- LLE xray with bony mets and dopplers neg for DVT - 4. E.COLI UTI  Continue ceftriaxone  Follow up urine cultures  5. Constipation/impaction  - Continue bowel regimen-  Senokot/MiraLAX/Dulcolax suppository    Code Status: DNR Family Communication: WIFE AT BEDSIDE  Disposition Plan: pending hospital course   Consultants:  none  Procedures:  echo Study Conclusions  - Left ventricle: The cavity size was normal. Wall thickness was increased in a pattern of mild LVH. Systolic function was normal. The estimated ejection fraction was in the range of 60% to 65%. Wall motion was normal; there were no regional wall motion abnormalities. - Aortic valve: There was very mild stenosis. - Mitral valve: Calcified annulus. - Left atrium: The atrium was moderately dilated. - Atrial septum: No defect or patent foramen ovale was identified. - Pulmonary arteries: Systolic pressure was  moderately increased. PA peak pressure: 53mm Hg (S). Transthoracic echocardiography.   Doppler US neg for DVT  Antibiotics:  ROCEHPIN started on 7/22  HPI/Subjective: States decreased SOB & leg swelling today. Reports decreased appetite and requesting his megace to be restarted.Denies cp   Objective: Filed Vitals:   03/13/13 1950 03/13/13 2038 03/14/13 0558 03/14/13 1346  BP: 152/68 149/64 147/60 141/58  Pulse: 99 96 99 93  Temp: 98.9 F (37.2 C) 98 F (36.7 C) 98.9 F (37.2 C) 98.6 F (37 C)  TempSrc: Oral Oral Oral   Resp: 18 18 18 16   Height:      Weight:   68.856 kg (151 lb 12.8 oz)   SpO2: 100% 100% 100% 99%    Intake/Output Summary (Last 24 hours) at 03/14/13 1552 Last data filed at 03/14/13 0900  Gross per 24 hour  Intake  262.5 ml  Output   1950 ml  Net -1687.5 ml   Filed Weights   03/13/13 1135 03/14/13 0558  Weight: 71.351 kg (157 lb 4.8 oz) 68.856 kg (151 lb 12.8 oz)    Exam:    Data Reviewed: Basic Metabolic Panel:  Recent Labs Lab 03/13/13 0820 03/14/13 0452  NA 135 135  K 3.7 3.5  CL 99 98  CO2 23 24  GLUCOSE 125* 112*  BUN 16 16  CREATININE 0.93 0.96  CALCIUM 9.3 9.1   Liver Function Tests:  Recent Labs Lab 03/13/13 0820  AST 67*  ALT 15  ALKPHOS 882*  BILITOT 0.2*  PROT 6.6  ALBUMIN 2.9*   No results found for  this basename: LIPASE, AMYLASE,  in the last 168 hours No results found for this basename: AMMONIA,  in the last 168 hours CBC:  Recent Labs Lab 03/13/13 0820 03/13/13 2231 03/14/13 0452  WBC 5.7  --  7.5  NEUTROABS 4.5  --   --   HGB 6.5* 8.2* 8.4*  HCT 19.9* 24.2* 25.5*  MCV 88.4  --  86.1  PLT 135*  --  111*   Cardiac Enzymes:  Recent Labs Lab 03/13/13 0821  TROPONINI <0.30   BNP (last 3 results)  Recent Labs  03/13/13 0821  PROBNP 889.4*   CBG: No results found for this basename: GLUCAP,  in the last 168 hours  Recent Results (from the past 240 hour(s))  URINE CULTURE     Status: None    Collection Time    03/13/13  9:07 AM      Result Value Range Status   Specimen Description URINE, CATHETERIZED   Final   Special Requests NONE   Final   Culture  Setup Time 03/13/2013 15:26   Final   Colony Count 80,000 COLONIES/ML   Final   Culture ESCHERICHIA COLI   Final   Report Status PENDING   Incomplete     Studies: Dg Chest 2 View  03/13/2013   *RADIOLOGY REPORT*  Clinical Data: Shortness of breath.   Prostate cancer.  CHEST - 2 VIEW  Comparison: 02/18/2012  Findings: Extensive sclerotic osseous metastasis.  Progressive since 02/18/2012.  Midline trachea.  Normal heart size.  Aortic atherosclerosis. No pleural effusion or pneumothorax.  Clear lungs.  IMPRESSION: Cardiomegaly, without acute superimposed cardiopulmonary disease.  Extensive osseous metastasis.  Progressive since 02/18/2012.   Original Report Authenticated By: Jeronimo Greaves, M.D.   Dg Tibia/fibula Left  03/13/2013   *RADIOLOGY REPORT*  Clinical Data: Left leg swelling and left knee pain.  History of metastatic prostate cancer.  LEFT TIBIA AND FIBULA - 2 VIEW  Comparison: Left femur 12/22/2012  Findings: Heterogeneous bone sclerosis involving the distal left femoral metaphysis, the proximal tibial metaphysis, and the proximal fibula consistent with history of metastatic prostate cancer.  Focal area of vague sclerosis along the mid shaft of the fibula is probably also metastatic.  Growth arrest line in the distal tibial metaphysis.  No evidence of acute fracture or subluxation.  Soft tissue calcifications are likely phleboliths.  IMPRESSION: Metastatic changes in the distal left femur, tibia and fibula as described.  No acute fractures demonstrated.   Original Report Authenticated By: Burman Nieves, M.D.   Ct Angio Chest Pe W/cm &/or Wo Cm  03/13/2013   *RADIOLOGY REPORT*  Clinical Data: Cough, shortness of breath and weakness.  History of metastatic prostate carcinoma to bone.  CT ANGIOGRAPHY CHEST  Technique:  Multidetector  CT imaging of the chest using the standard protocol during bolus administration of intravenous contrast. Multiplanar reconstructed images including MIPs were obtained and reviewed to evaluate the vascular anatomy.  Contrast: 60mL OMNIPAQUE IOHEXOL 350 MG/ML SOLN  Comparison: None.  Findings: The pulmonary arteries are well opacified.  There is no evidence of acute pulmonary embolism.  There is a small right-sided pleural effusion with associated atelectasis of the posterior right lower lobe.  Effusion appears to be uncomplicated and is not associated with obvious pleural mass or peripheral pleural enhancement.  There is no evidence of pulmonary infiltrate, nodule or edema. Heart size is within normal limits.  No lymphadenopathy is identified in the chest.  Extensive bony metastatic disease is again identified assessed involving the  entire bony thorax with a sclerotic lesions identified.  There is no evidence to suggest a pathologic fracture.  IMPRESSION:  1.  No evidence of pulmonary embolism. 2.  Small right pleural effusion. 3.  Extensive sclerotic bony metastatic disease.   Original Report Authenticated By: Irish Lack, M.D.    Scheduled Meds: . cefTRIAXone (ROCEPHIN)  IV  1 g Intravenous Q24H  . enoxaparin (LOVENOX) injection  40 mg Subcutaneous Q24H  . ferrous sulfate  325 mg Oral Daily  . furosemide  40 mg Intravenous Q12H  . potassium chloride  40 mEq Oral Daily  . senna-docusate  1 tablet Oral BID   Continuous Infusions:   Active Problems:   Prostate cancer metastatic to multiple sites   Constipation   Failure to thrive   Anemia   Dyspnea   Malignant neoplasm of prostate    Time spent: 35    [facility]  [facility] Pager [phone]. If 7PM-7AM, please contact night-coverage at [email or web address], password [facility] 03/14/2013, 3:52 PM  LOS: 1 day

## 2013-03-14 NOTE — Progress Notes (Signed)
VASCULAR LAB PRELIMINARY  PRELIMINARY  PRELIMINARY  PRELIMINARY  Bilateral lower extremity venous duplex  completed.    Preliminary report:  Bilateral:  No evidence of DVT, superficial thrombosis, or Baker's Cyst.    Debroh Sieloff, RVT 03/14/2013, 2:10 PM

## 2013-03-14 NOTE — Progress Notes (Signed)
Pt c/o blood in urine. Per pt has happened before when he in & out caths at home. Upon assessment blood noticed and old around catheter site. New urine in tube WNL. MD made aware. Will continue to monitor the pt. [name]

## 2013-03-15 LAB — BASIC METABOLIC PANEL
BUN: 18 mg/dL (ref 6–23)
Chloride: 96 mEq/L (ref 96–112)
GFR calc non Af Amer: 80 mL/min — ABNORMAL LOW (ref >90)
Glucose, Bld: 115 mg/dL — ABNORMAL HIGH (ref 70–99)
Potassium: 3.3 mEq/L — ABNORMAL LOW (ref 3.5–5.1)

## 2013-03-15 LAB — CBC
HCT: 28 % — ABNORMAL LOW (ref 39.0–52.0)
Hemoglobin: 9.2 g/dL — ABNORMAL LOW (ref 13.0–17.0)
MCHC: 32.9 g/dL (ref 30.0–36.0)

## 2013-03-15 LAB — URINE CULTURE

## 2013-03-15 MED ORDER — FUROSEMIDE 40 MG PO TABS
40.0000 mg | ORAL_TABLET | Freq: Two times a day (BID) | ORAL | Status: DC
  Administered 2013-03-16: 40 mg via ORAL
  Filled 2013-03-15 (×3): qty 1

## 2013-03-15 MED ORDER — POTASSIUM CHLORIDE CRYS ER 20 MEQ PO TBCR
60.0000 meq | EXTENDED_RELEASE_TABLET | Freq: Once | ORAL | Status: AC
  Administered 2013-03-15: 60 meq via ORAL
  Filled 2013-03-15: qty 3

## 2013-03-15 NOTE — Progress Notes (Addendum)
[facility] PROGRESS NOTE  Zachary Mcguire ZOX:[id number] DOB: 1941-06-19 DOA: 03/13/2013 PCP: Eustaquio Boyden, MD  Assessment/Plan:  1. Dyspnea/ High output CHF, acute-  Secondary to severe anemia -Significantly elevated D dimer with CA, CTA chest >> neg for PE and doppler US neg -s/p Transfuse 2 units PRBC >>hgb 8.4 this am -change lasix to PO, follow  - 2D ECHO  With EF 60-65% and no wall motion abnl 2. Anemia  -acute on chronic, transfuse 2 units PRBC  -most likely related to metastatic Prostate CA  -Heme check in ER with trace heme positive brown stool  -no gross bleeding, hgb stable this am. No further GI indicated at this time -continue PPI  3. Prostate Cancer with extensive bony metastasis  - Has declined chemotherapy and XRT in the past  - Discussed patient's family about hospice and palliative care, wife states today she wants to talk with her family first - oxycodone prn for chronic bony pain  - Significant chronic bilateral lower extremity weakness for over 1 year but L>R- LLE xray with bony mets and dopplers neg for DVT -pt still does not want any treatment for Ca 4. E.COLI UTI  Continue ceftriaxone  5. Constipation/impaction  - Continue bowel regimen-  Senokot/MiraLAX/Dulcolax suppository    Code Status: DNR Family Communication: WIFE AT BEDSIDE  Disposition Plan: plan d/c in am if continue to improve   Consultants:  none  Procedures:  echo Study Conclusions  - Left ventricle: The cavity size was normal. Wall thickness was increased in a pattern of mild LVH. Systolic function was normal. The estimated ejection fraction was in the range of 60% to 65%. Wall motion was normal; there were no regional wall motion abnormalities. - Aortic valve: There was very mild stenosis. - Mitral valve: Calcified annulus. - Left atrium: The atrium was moderately dilated. - Atrial septum: No defect or patent foramen ovale was identified. - Pulmonary arteries:  Systolic pressure was moderately increased. PA peak pressure: 53mm Hg (S). Transthoracic echocardiography.   Doppler US neg for DVT  Antibiotics:  ROCEHPIN started on 7/22  HPI/Subjective: Still with poor po intake, but states he feels much better. Wife at bedside. Denies any gross bleeding  Objective: Filed Vitals:   03/14/13 1346 03/14/13 1700 03/14/13 2044 03/15/13 0630  BP: 141/58  161/75 138/60  Pulse: 93  111 100  Temp: 98.6 F (37 C) 98.2 F (36.8 C) 99.6 F (37.6 C) 98.5 F (36.9 C)  TempSrc:   Oral Axillary  Resp: 16  18 18   Height:      Weight:    68.584 kg (151 lb 3.2 oz)  SpO2: 99%  95% 100%    Intake/Output Summary (Last 24 hours) at 03/15/13 1358 Last data filed at 03/14/13 2045  Gross per 24 hour  Intake      0 ml  Output    800 ml  Net   -800 ml   Filed Weights   03/13/13 1135 03/14/13 0558 03/15/13 0630  Weight: 71.351 kg (157 lb 4.8 oz) 68.856 kg (151 lb 12.8 oz) 68.584 kg (151 lb 3.2 oz)     Exam:  General: alert & oriented x 3 In NAD Cardiovascular: RRR, nl S1 s2 Respiratory: CTAB Abdomen: soft +BS NT/ND, no masses palpable Extremities:decreased LE edema L>R, no cyanosis    Data Reviewed: Basic Metabolic Panel:  Recent Labs Lab 03/13/13 0820 03/14/13 0452 03/15/13 0626  NA 135 135 134*  K 3.7 3.5 3.3*  CL 99 98 96  CO2 23 24 25   GLUCOSE 125* 112* 115*  BUN 16 16 18   CREATININE 0.93 0.96 0.98  CALCIUM 9.3 9.1 9.2   Liver Function Tests:  Recent Labs Lab 03/13/13 0820  AST 67*  ALT 15  ALKPHOS 882*  BILITOT 0.2*  PROT 6.6  ALBUMIN 2.9*   No results found for this basename: LIPASE, AMYLASE,  in the last 168 hours No results found for this basename: AMMONIA,  in the last 168 hours CBC:  Recent Labs Lab 03/13/13 0820 03/13/13 2231 03/14/13 0452 03/15/13 0626  WBC 5.7  --  7.5 8.5  NEUTROABS 4.5  --   --   --   HGB 6.5* 8.2* 8.4* 9.2*  HCT 19.9* 24.2* 25.5* 28.0*  MCV 88.4  --  86.1 86.7  PLT 135*  --  111*  122*   Cardiac Enzymes:  Recent Labs Lab 03/13/13 0821  TROPONINI <0.30   BNP (last 3 results)  Recent Labs  03/13/13 0821  PROBNP 889.4*   CBG: No results found for this basename: GLUCAP,  in the last 168 hours  Recent Results (from the past 240 hour(s))  URINE CULTURE     Status: None   Collection Time    03/13/13  9:07 AM      Result Value Range Status   Specimen Description URINE, CATHETERIZED   Final   Special Requests NONE   Final   Culture  Setup Time 03/13/2013 15:26   Final   Colony Count 80,000 COLONIES/ML   Final   Culture ESCHERICHIA COLI   Final   Report Status 03/15/2013 FINAL   Final   Organism ID, Bacteria ESCHERICHIA COLI   Final     Studies: Dg Tibia/fibula Left  03/13/2013   *RADIOLOGY REPORT*  Clinical Data: Left leg swelling and left knee pain.  History of metastatic prostate cancer.  LEFT TIBIA AND FIBULA - 2 VIEW  Comparison: Left femur 12/22/2012  Findings: Heterogeneous bone sclerosis involving the distal left femoral metaphysis, the proximal tibial metaphysis, and the proximal fibula consistent with history of metastatic prostate cancer.  Focal area of vague sclerosis along the mid shaft of the fibula is probably also metastatic.  Growth arrest line in the distal tibial metaphysis.  No evidence of acute fracture or subluxation.  Soft tissue calcifications are likely phleboliths.  IMPRESSION: Metastatic changes in the distal left femur, tibia and fibula as described.  No acute fractures demonstrated.   Original Report Authenticated By: Burman Nieves, M.D.   Ct Angio Chest Pe W/cm &/or Wo Cm  03/13/2013   *RADIOLOGY REPORT*  Clinical Data: Cough, shortness of breath and weakness.  History of metastatic prostate carcinoma to bone.  CT ANGIOGRAPHY CHEST  Technique:  Multidetector CT imaging of the chest using the standard protocol during bolus administration of intravenous contrast. Multiplanar reconstructed images including MIPs were obtained and  reviewed to evaluate the vascular anatomy.  Contrast: 60mL OMNIPAQUE IOHEXOL 350 MG/ML SOLN  Comparison: None.  Findings: The pulmonary arteries are well opacified.  There is no evidence of acute pulmonary embolism.  There is a small right-sided pleural effusion with associated atelectasis of the posterior right lower lobe.  Effusion appears to be uncomplicated and is not associated with obvious pleural mass or peripheral pleural enhancement.  There is no evidence of pulmonary infiltrate, nodule or edema. Heart size is within normal limits.  No lymphadenopathy is identified in the chest.  Extensive bony metastatic disease is again identified assessed involving the entire bony thorax  with a sclerotic lesions identified.  There is no evidence to suggest a pathologic fracture.  IMPRESSION:  1.  No evidence of pulmonary embolism. 2.  Small right pleural effusion. 3.  Extensive sclerotic bony metastatic disease.   Original Report Authenticated By: Irish Lack, M.D.    Scheduled Meds: . cefTRIAXone (ROCEPHIN)  IV  1 g Intravenous Q24H  . enoxaparin (LOVENOX) injection  40 mg Subcutaneous Q24H  . ferrous sulfate  325 mg Oral Daily  . [START ON 03/16/2013] furosemide  40 mg Oral BID  . megestrol  200 mg Oral QAC breakfast  . potassium chloride  40 mEq Oral Daily  . potassium chloride  60 mEq Oral Once  . senna-docusate  1 tablet Oral BID   Continuous Infusions:   Active Problems:   Prostate cancer metastatic to multiple sites   Constipation   Failure to thrive   Anemia   Dyspnea   Malignant neoplasm of prostate    Time spent: 25    [facility]  [facility] Pager [phone]. If 7PM-7AM, please contact night-coverage at [email or web address], password [facility] 03/15/2013, 1:58 PM  LOS: 2 days

## 2013-03-16 ENCOUNTER — Telehealth: Payer: Self-pay | Admitting: Family Medicine

## 2013-03-16 LAB — BASIC METABOLIC PANEL
BUN: 19 mg/dL (ref 6–23)
CO2: 24 mEq/L (ref 19–32)
Chloride: 98 mEq/L (ref 96–112)
Creatinine, Ser: 0.91 mg/dL (ref 0.50–1.35)
Glucose, Bld: 98 mg/dL (ref 70–99)

## 2013-03-16 MED ORDER — POTASSIUM CHLORIDE ER 10 MEQ PO TBCR: 10.0000 meq | EXTENDED_RELEASE_TABLET | Freq: Every day | ORAL | Status: AC

## 2013-03-16 MED ORDER — NYSTATIN 100000 UNIT/ML MT SUSP: 5.0000 mL | Freq: Three times a day (TID) | OROMUCOSAL | Status: DC | PRN

## 2013-03-16 MED ORDER — CEFUROXIME AXETIL 500 MG PO TABS: 500.0000 mg | ORAL_TABLET | Freq: Two times a day (BID) | ORAL | Status: DC

## 2013-03-16 MED ORDER — FLEET ENEMA 7-19 GM/118ML RE ENEM
1.0000 | ENEMA | Freq: Every day | RECTAL | Status: DC | PRN
  Administered 2013-03-16: 13:00:00 via RECTAL
  Filled 2013-03-16: qty 1

## 2013-03-16 NOTE — Evaluation (Signed)
Occupational Therapy Evaluation Patient Details Name: Zachary Mcguire MRN: [id number] DOB: 06-02-1941 Today's Date: 03/16/2013 Time: 3086-5784 OT Time Calculation (min): 16 min  OT Assessment / Plan / Recommendation History of present illness Zachary Mcguire is a 72 y.o. 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   Clinical Impression   Pt admitted with dyspnea.  Will continue to follow acutely in order to address below problem list.    OT Assessment  Patient needs continued OT Services    Follow Up Recommendations  Home health OT;Supervision/Assistance - 24 hour    Barriers to Discharge      Equipment Recommendations  Tub/shower bench    Recommendations for Other Services    Frequency  Min 2X/week    Precautions / Restrictions Precautions Precautions: Fall   Pertinent Vitals/Pain See vitals    ADL  Grooming: Performed;Wash/dry hands;Wash/dry face;Set up Where Assessed - Grooming: Unsupported sitting Upper Body Bathing: Simulated;Minimal assistance Where Assessed - Upper Body Bathing: Unsupported sitting Lower Body Bathing: Simulated;Maximal assistance Where Assessed - Lower Body Bathing: Unsupported sitting Upper Body Dressing: Simulated;Minimal assistance Where Assessed - Upper Body Dressing: Unsupported sitting Lower Body Dressing: Performed;Maximal assistance Where Assessed - Lower Body Dressing: Unsupported sitting Transfers/Ambulation Related to ADLs: pt declining OOB mobility stating he was up earlier this AM ADL Comments: Presenting with generalized weakness during all tasks. Wife not present to determine if this is baseline level of assist needed.    OT Diagnosis: Generalized weakness;Acute pain  OT Problem List: Decreased strength;Decreased activity tolerance;Impaired balance (sitting and/or standing);Decreased knowledge of use of DME or AE;Pain OT Treatment Interventions: Self-care/ADL training;DME and/or AE  instruction;Therapeutic activities;Patient/family education;Balance training   OT Goals(Current goals can be found in the care plan section) Acute Rehab OT Goals Patient Stated Goal: return home OT Goal Formulation: With patient Time For Goal Achievement: 03/30/13 Potential to Achieve Goals: Good  Visit Information  Last OT Received On: 03/16/13 Assistance Needed: +1 History of Present Illness: Zachary Mcguire is a 72 y.o. 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       Prior Functioning     Home Living Family/patient expects to be discharged to:: Private residence Living Arrangements: Spouse/significant other Available Help at Discharge: Family;Available 24 hours/day Type of Home: House Home Access: Ramped entrance Home Layout: One level Home Equipment: Walker - 2 wheels;Walker - 4 wheels;Crutches;Bedside commode Prior Function Level of Independence: Needs assistance Gait / Transfers Assistance Needed: assist with transfers to stand and any bed mobility, pt has been sleeping in recliner for 2 months, uses crutches for ambulation because scared of RW wheels ADL's / Homemaking Assistance Needed: spouse assist for bathing and dressing Comments: does not drive, WC for long distances Communication Communication: No difficulties         Vision/Perception     Cognition  Cognition Arousal/Alertness: Awake/alert Behavior During Therapy: Flat affect Overall Cognitive Status: Within Functional Limits for tasks assessed    Extremity/Trunk Assessment Upper Extremity Assessment Upper Extremity Assessment: Generalized weakness Lower Extremity Assessment Lower Extremity Assessment: Generalized weakness (unable to fully assess due to pain) Cervical / Trunk Assessment Cervical / Trunk Assessment: Kyphotic     Mobility Bed Mobility Bed Mobility: Supine to Sit;Sitting - Scoot to Edge of Bed;Sit to Supine Supine to Sit: 1: +1 Total assist;HOB  elevated;With rails Sitting - Scoot to Edge of Bed: 3: Mod assist Sit to Supine: 2: Max assist;HOB flat Details for Bed Mobility Assistance: Assist to bring LEs  and trunk OOB. Use of draw pad to scoot hips EOB. Transfers Sit to Stand: 3: Mod assist;From bed Stand to Sit: 4: Min assist;To bed Details for Transfer Assistance: cueing for hand placement and safety with assist for anterior translation     Exercise     Balance Balance Balance Assessed: Yes Static Sitting Balance Static Sitting - Balance Support: No upper extremity supported;Feet supported Static Sitting - Level of Assistance: 5: Stand by assistance Static Sitting - Comment/# of Minutes: 3-4 minutes   End of Session OT - End of Session Activity Tolerance: Patient limited by fatigue Patient left: in bed;with call bell/phone within reach Nurse Communication: Mobility status  GO    03/16/2013 Cipriano Mile OTR/L Pager [phone] Office [phone]  Cipriano Mile 03/16/2013, 1:18 PM

## 2013-03-16 NOTE — Telephone Encounter (Signed)
Pt discharged today from hospital - with acute CHF due to severe anemia from prostate cancer with mets. Can we call to follow up on how he's doing since discharge, ensure he's taking lasix 20mg  daily, and to schedule hosp f/u appt with me for next week?  Thanks.

## 2013-03-16 NOTE — Discharge Instructions (Signed)
Home Health  Services arranged with [facility]. [phone]. Physical and Occupational Therapy. Durable Medical Equipment: Suction and Tub bench to be delivered to room.

## 2013-03-16 NOTE — Progress Notes (Signed)
Foley d/c.  Pt states he may need to have I/O cath before getting d/c home today.  I advised pt to notify me need to urinate and I would get MD to order I/O cath if necessary.  Pt agrees.

## 2013-03-16 NOTE — Care Management (Signed)
Pt's wife opted not to get the tub bench and suction form [facility]. Pt's wife will call the [employer] store on [address] to see what the cost will be. Pt will have HH services via [facility]. No further needs from Cm at this time. Gala Lewandowsky, RN,BSN [phone]

## 2013-03-16 NOTE — Care Management Note (Signed)
    Page 1 of 2   03/16/2013     2:26:27 PM   CARE MANAGEMENT NOTE 03/16/2013  Patient:  Zachary Mcguire, Zachary Mcguire   Account Number:  [id number]  Date Initiated:  03/16/2013  Documentation initiated by:  GRAVES-BIGELOW,Marvens Hollars  Subjective/Objective Assessment:   Pt admitted with anemia. Hx metastatic Prostate CA. Pt is from home with wife.     Action/Plan:   CM to offer choice for [facility] services PT/OT and dme: tub bench and suction set up. CM did call Wife and she had stepped out to run some errands before d/c.   Anticipated DC Date:  03/16/2013   Anticipated DC Plan:  HOME W HOME HEALTH SERVICES      DC Planning Services  CM consult      PAC Choice  DURABLE MEDICAL EQUIPMENT  HOME HEALTH   Choice offered to / List presented to:  C-3 Spouse   DME arranged  TUB BENCH  SUCTION      DME agency  [facility]     HH arranged  HH-2 PT  HH-3 OT      [facility] agency  [facility]   Status of service:  Completed, signed off Medicare Important Message given?   (If response is "NO", the following Medicare IM given date fields will be blank) Date Medicare IM given:   Date Additional Medicare IM given:    Discharge Disposition:  HOME W HOME HEALTH SERVICES  Per UR Regulation:  Reviewed for med. necessity/level of care/duration of stay  If discussed at Long Length of Stay Meetings, dates discussed:    Comments:  03-16-13 [address] [place], [place] [phone] CM did seak to pt and wife in reference to [facility] services and they chose [facility] for services. SOC tobegin within 24-48 hours post d/c. CM did make referral for services. DME to be delivered to room before d/c. No further needs from CM at this time.

## 2013-03-16 NOTE — Discharge Summary (Signed)
Physician Discharge Summary  Zachary Mcguire:[id number] DOB: 1941/01/18 DOA: 03/13/2013  PCP: Zachary Boyden, MD  Admit date: 03/13/2013 Discharge date: 03/16/2013  Time spent: >30 minutes  Recommendations for Outpatient Follow-up:      Follow-up Information   Follow up with Zachary Boyden, MD. (in 1week, call for appt upon discharge)    Contact information:   [address] [place] [address] [phone]        Discharge Diagnoses:  Active Problems:   Prostate cancer metastatic to multiple sites   Constipation   Failure to thrive   Anemia   Dyspnea   Malignant neoplasm of prostate   Discharge Condition: improved/stable  Diet recommendation:   Filed Weights   03/14/13 0558 03/15/13 0630 03/16/13 0500  Weight: 68.856 kg (151 lb 12.8 oz) 68.584 kg (151 lb 3.2 oz) 68.856 kg (151 lb 12.8 oz)    History of present illness:  Zachary Mcguire is a 72 y.o. 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  He has never had any treatment for his Prostate CA for the last 10-15 years.  He has had several medical problems in the last year, progressive lower leg weakness, pain in his Leg especially R thigh/hip.  He presents to the ER today with dyspnea on exertion since yesterday.  He has also had lower extremity edema for 3-6 months.  Upon evaluation in there ER, he was noted to have Hb of 6.5, down from 7.2gm/dl 10 days ago.  No hematemesis, melena, hematochezia.   Hospital Course:  1. Dyspnea/ High output CHF, acute- Secondary to severe anemia  As discussed above upon admission the patient had a chest x-ray done which showed cardiomegaly without acute superimposed on cardiopulmonary disease, extensive cautious metastasis was noted. A d-dimer was done and was elevated. CTangio of chest was done and came back negative for PE. His BNP was done and was elevated at 889.4. He was started on IV Lasix on admission. he was noted to be  severely anemic with a hemoglobin of 6.5. he was transfused 2 units of packed red blood cells. A 2D ECHO was done and showed EF 60-65% and no wall motion abnl the impression was that patient had high output CHF secondary to severe anemia. His symptoms improved with diuresis, was changed to oral Lasix and he is to continue this upon discharge 2. Anemia  -acute on chronic, transfuse 2 units PRBC  -most likely related to metastatic Prostate CA  -Heme check in ER with trace heme positive brown stool  -no gross bleeding, hgb stable this am. No further GI indicated at this time  -He is to continue his iron supplementation as prior to admission and follow up with his PCP 3. Prostate Cancer with extensive bony metastasis  - Has declined chemotherapy and XRT in the past, and declined oncology consultation while in the hospital as he still does not wish to have any treatment for the cancer. - Discussed hospice and palliative care consultation but wife not interested at this time, he is to followup with his PCP. -As noted above patient had significantly elevated d-dimer and CT and she was negative for PE, Dopplers of lower extremities negative for DVT. Significant chronic bilateral lower extremity weakness for over 1 year but L>R- LLE xray with bony mets. -pt still does not want any treatment for Ca as above -PT consulted and home health is PT recommended 4. E.COLI UTI  -Patient had urine cultures done on admission  grew Escherichia coli, and he was started on Rocephin to which it is sensitive. He'll be discharged on Ceftin to complete the antibiotic course his to followup with his PCP 5. Constipation/impaction  - Patient was placed on bowel regimen while in the hospital and also given an enema prior to discharge.Continue bowel regimen- Senokot/MiraLAX upon discharge.  Consultants:  none Procedures:  echo Study Conclusions  - Left ventricle: The cavity size was normal. Wall thickness was increased in a  pattern of mild LVH. Systolic function was normal. The estimated ejection fraction was in the range of 60% to 65%. Wall motion was normal; there were no regional wall motion abnormalities. - Aortic valve: There was very mild stenosis. - Mitral valve: Calcified annulus. - Left atrium: The atrium was moderately dilated. - Atrial septum: No defect or patent foramen ovale was identified. - Pulmonary arteries: Systolic pressure was moderately increased. PA peak pressure: 53mm Hg (S). Transthoracic echocardiography.  Doppler US neg for DVT   Exam:  General: alert & oriented x 3 In NAD Cardiovascular: RRR, nl S1 s2 Respiratory: CTAB Abdomen: soft +BS NT/ND, no masses palpable Extremities:decreased LE edema L>R    Discharge Exam: Filed Vitals:   03/15/13 1617 03/15/13 2150 03/16/13 0500 03/16/13 0957  BP: 129/52 156/66 147/76 142/78  Pulse: 81 100 107 116  Temp: 98.3 F (36.8 C) 99.8 F (37.7 C) 99 F (37.2 C)   TempSrc: Oral Oral Oral   Resp: 20 20 20    Height:      Weight:   68.856 kg (151 lb 12.8 oz)   SpO2: 95%  99%     \Discharge Instructions  Discharge Orders   Future Appointments Provider Department Dept Phone   04/10/2013 12:15 PM Zachary Boyden, MD [facility] [phone]   Future Orders Complete By Expires     Diet - low sodium heart healthy  As directed     Increase activity slowly  As directed         Medication List         ALPRAZolam 0.5 MG tablet  Commonly known as:  XANAX  Take 0.5 mg by mouth See admin instructions. Take 1 tablet every morning scheduled and take 1 tablet every evening as needed for restless legs and anxiety     cefUROXime 500 MG tablet  Commonly known as:  CEFTIN  Take 1 tablet (500 mg total) by mouth 2 (two) times daily.     ferrous sulfate 325 (65 FE) MG tablet  Take 325 mg by mouth daily.     furosemide 20 MG tablet  Commonly known as:  LASIX  Take 20 mg by mouth 2 (two) times daily.      ketoconazole 2 % cream  Commonly known as:  NIZORAL  Apply 1 application topically daily as needed (for itching).     megestrol 40 MG/ML suspension  Commonly known as:  MEGACE  Take 5 mLs (200 mg total) by mouth daily.     nystatin 100000 UNIT/ML suspension  Commonly known as:  MYCOSTATIN  Take 5 mLs (500,000 Units total) by mouth 3 (three) times daily as needed (as needed for thrush).     OVER THE COUNTER MEDICATION  Take 15 mLs by mouth daily. Black strait (iron supplement)     polyethylene glycol powder powder  Commonly known as:  GLYCOLAX/MIRALAX  Take 17 g by mouth daily as needed (constipation).     potassium chloride 10 MEQ tablet  Commonly known as:  K-DUR  Take 1 tablet (10 mEq total) by mouth daily.     S.S.S. Tonic Liqd  Take 30 mLs by mouth daily.     traMADol 100 MG 24 hr tablet  Commonly known as:  ULTRAM-ER  Take 100 mg by mouth daily as needed for pain.     VISINE 0.05 % ophthalmic solution  Generic drug:  tetrahydrozoline  Place 1 drop into both eyes 2 (two) times daily as needed (for allergies).       Allergies  Allergen Reactions  . Voltaren (Diclofenac Sodium) Other (See Comments)    hallucinations       Follow-up Information   Follow up with Zachary Boyden, MD. (in 1week, call for appt upon discharge)    Contact information:   [address] [place] [address] [phone]        The results of significant diagnostics from this hospitalization (including imaging, microbiology, ancillary and laboratory) are listed below for reference.    Significant Diagnostic Studies: Dg Chest 2 View  03/13/2013   *RADIOLOGY REPORT*  Clinical Data: Shortness of breath.   Prostate cancer.  CHEST - 2 VIEW  Comparison: 02/18/2012  Findings: Extensive sclerotic osseous metastasis.  Progressive since 02/18/2012.  Midline trachea.  Normal heart size.  Aortic atherosclerosis. No pleural effusion or pneumothorax.  Clear lungs.  IMPRESSION:  Cardiomegaly, without acute superimposed cardiopulmonary disease.  Extensive osseous metastasis.  Progressive since 02/18/2012.   Original Report Authenticated By: Jeronimo Greaves, M.D.   Dg Tibia/fibula Left  03/13/2013   *RADIOLOGY REPORT*  Clinical Data: Left leg swelling and left knee pain.  History of metastatic prostate cancer.  LEFT TIBIA AND FIBULA - 2 VIEW  Comparison: Left femur 12/22/2012  Findings: Heterogeneous bone sclerosis involving the distal left femoral metaphysis, the proximal tibial metaphysis, and the proximal fibula consistent with history of metastatic prostate cancer.  Focal area of vague sclerosis along the mid shaft of the fibula is probably also metastatic.  Growth arrest line in the distal tibial metaphysis.  No evidence of acute fracture or subluxation.  Soft tissue calcifications are likely phleboliths.  IMPRESSION: Metastatic changes in the distal left femur, tibia and fibula as described.  No acute fractures demonstrated.   Original Report Authenticated By: Burman Nieves, M.D.   Ct Angio Chest Pe W/cm &/or Wo Cm  03/13/2013   *RADIOLOGY REPORT*  Clinical Data: Cough, shortness of breath and weakness.  History of metastatic prostate carcinoma to bone.  CT ANGIOGRAPHY CHEST  Technique:  Multidetector CT imaging of the chest using the standard protocol during bolus administration of intravenous contrast. Multiplanar reconstructed images including MIPs were obtained and reviewed to evaluate the vascular anatomy.  Contrast: 60mL OMNIPAQUE IOHEXOL 350 MG/ML SOLN  Comparison: None.  Findings: The pulmonary arteries are well opacified.  There is no evidence of acute pulmonary embolism.  There is a small right-sided pleural effusion with associated atelectasis of the posterior right lower lobe.  Effusion appears to be uncomplicated and is not associated with obvious pleural mass or peripheral pleural enhancement.  There is no evidence of pulmonary infiltrate, nodule or edema. Heart size  is within normal limits.  No lymphadenopathy is identified in the chest.  Extensive bony metastatic disease is again identified assessed involving the entire bony thorax with a sclerotic lesions identified.  There is no evidence to suggest a pathologic fracture.  IMPRESSION:  1.  No evidence of pulmonary embolism. 2.  Small right pleural effusion. 3.  Extensive sclerotic bony  metastatic disease.   Original Report Authenticated By: Irish Lack, M.D.    Microbiology: Recent Results (from the past 240 hour(s))  URINE CULTURE     Status: None   Collection Time    03/13/13  9:07 AM      Result Value Range Status   Specimen Description URINE, CATHETERIZED   Final   Special Requests NONE   Final   Culture  Setup Time 03/13/2013 15:26   Final   Colony Count 80,000 COLONIES/ML   Final   Culture ESCHERICHIA COLI   Final   Report Status 03/15/2013 FINAL   Final   Organism ID, Bacteria ESCHERICHIA COLI   Final     Labs: Basic Metabolic Panel:  Recent Labs Lab 03/13/13 0820 03/14/13 0452 03/15/13 0626 03/16/13 0405  NA 135 135 134* 134*  K 3.7 3.5 3.3* 4.3  CL 99 98 96 98  CO2 23 24 25 24   GLUCOSE 125* 112* 115* 98  BUN 16 16 18 19   CREATININE 0.93 0.96 0.98 0.91  CALCIUM 9.3 9.1 9.2 9.5   Liver Function Tests:  Recent Labs Lab 03/13/13 0820  AST 67*  ALT 15  ALKPHOS 882*  BILITOT 0.2*  PROT 6.6  ALBUMIN 2.9*   No results found for this basename: LIPASE, AMYLASE,  in the last 168 hours No results found for this basename: AMMONIA,  in the last 168 hours CBC:  Recent Labs Lab 03/13/13 0820 03/13/13 2231 03/14/13 0452 03/15/13 0626  WBC 5.7  --  7.5 8.5  NEUTROABS 4.5  --   --   --   HGB 6.5* 8.2* 8.4* 9.2*  HCT 19.9* 24.2* 25.5* 28.0*  MCV 88.4  --  86.1 86.7  PLT 135*  --  111* 122*   Cardiac Enzymes:  Recent Labs Lab 03/13/13 0821  TROPONINI <0.30   BNP: BNP (last 3 results)  Recent Labs  03/13/13 0821  PROBNP 889.4*   CBG: No results found for  this basename: GLUCAP,  in the last 168 hours     Signed:  Kaprice Kage C  [facility] 03/16/2013, 11:53 AM

## 2013-03-16 NOTE — Telephone Encounter (Signed)
Called pt, still awaiting d/c from hospital.  Will call again on Monday.

## 2013-03-16 NOTE — Evaluation (Signed)
Physical Therapy Evaluation Patient Details Name: Zachary Mcguire MRN: [id number] DOB: 03-16-1941 Today's Date: 03/16/2013 Time: 4098-1191 PT Time Calculation (min): 38 min  PT Assessment / Plan / Recommendation History of Present Illness  Zachary Mcguire is a 72 y.o. 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  Clinical Impression  Pt very painful with decreased mobility bil LE all joints with education for stretches and ROM. Pt with limited mobility at baseline but will benefit from acute therapy to maximize sequence, function and safety to decrease burden of care at discharge. Pt denied OOB in chair today due to fatigue but recommend OOB for meals with nursing assist.     PT Assessment  Patient needs continued PT services    Follow Up Recommendations  Home health PT    Does the patient have the potential to tolerate intense rehabilitation      Barriers to Discharge        Equipment Recommendations  Other (comment) (tub bench)    Recommendations for Other Services     Frequency Min 3X/week    Precautions / Restrictions Precautions Precautions: Fall   Pertinent Vitals/Pain Grossly 5/10 generalized pain bil LE      Mobility  Bed Mobility Bed Mobility: Supine to Sit;Sit to Supine;Sitting - Scoot to Edge of Bed Supine to Sit: 2: Max assist;HOB elevated;With rails Sitting - Scoot to Edge of Bed: 3: Mod assist Sit to Supine: 1: +1 Total assist Details for Bed Mobility Assistance: pt assist with legs together to pivot to EOB and elevate trunk pt providing minimal assist. Attempted sit to side but pt not following commands and attempting to roll into supine simultaneously with total assist for bil LE and to control trunk . Assist of pad to move hips with scooting Transfers Transfers: Sit to Stand;Stand to Sit Sit to Stand: 3: Mod assist;From bed Stand to Sit: 4: Min assist;To bed Details for Transfer Assistance: cueing for hand placement and  safety with assist for anterior translation Ambulation/Gait Ambulation/Gait Assistance: 4: Min assist Ambulation Distance (Feet): 20 Feet Assistive device: Rolling walker Ambulation/Gait Assistance Details: cueing for sequence with assist x 2 to fully weight shift to advance bil LE Gait Pattern: Step-to pattern;Decreased stance time - left Gait velocity: decreased Stairs: No    Exercises     PT Diagnosis: Difficulty walking;Acute pain  PT Problem List: Decreased strength;Decreased range of motion;Decreased activity tolerance;Decreased mobility;Decreased knowledge of use of DME;Pain PT Treatment Interventions: Gait training;DME instruction;Functional mobility training;Therapeutic activities;Therapeutic exercise;Patient/family education     PT Goals(Current goals can be found in the care plan section) Acute Rehab PT Goals Patient Stated Goal: return home PT Goal Formulation: With patient/family Time For Goal Achievement: 03/30/13 Potential to Achieve Goals: Fair  Visit Information  Last PT Received On: 03/16/13 Assistance Needed: +1 History of Present Illness: Zachary Mcguire is a 72 y.o. 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       Prior Functioning  Home Living Family/patient expects to be discharged to:: Private residence Living Arrangements: Spouse/significant other Available Help at Discharge: Family;Available 24 hours/day Type of Home: House Home Access: Ramped entrance Home Layout: One level Home Equipment: Walker - 2 wheels;Walker - 4 wheels;Crutches;Bedside commode Prior Function Level of Independence: Needs assistance Gait / Transfers Assistance Needed: assist with transfers to stand and any bed mobility, pt has been sleeping in recliner for 2 months, uses crutches for ambulation because scared of RW wheels ADL's / Homemaking Assistance Needed:  spouse assist for bathing and dressing Comments: does not drive, WC for long  distances Communication Communication: No difficulties    Cognition  Cognition Arousal/Alertness: Awake/alert Behavior During Therapy: Flat affect Overall Cognitive Status: Within Functional Limits for tasks assessed (pt with difficulty following commands with bed mobility)    Extremity/Trunk Assessment Upper Extremity Assessment Upper Extremity Assessment: Defer to OT evaluation Lower Extremity Assessment Lower Extremity Assessment: Generalized weakness (unable to fully assess due to pain) Cervical / Trunk Assessment Cervical / Trunk Assessment: Kyphotic   Balance Balance Balance Assessed: Yes Static Sitting Balance Static Sitting - Balance Support: No upper extremity supported;Feet supported Static Sitting - Level of Assistance: 5: Stand by assistance Static Sitting - Comment/# of Minutes: 2  End of Session PT - End of Session Equipment Utilized During Treatment: Gait belt Activity Tolerance: Patient limited by pain;Patient limited by fatigue Patient left: in bed;with call bell/phone within reach;with family/visitor present Nurse Communication: Mobility status  GP     Toney Sang [facility] 03/16/2013, 10:11 AM Delaney Meigs, PT [phone]

## 2013-03-16 NOTE — Clinical Documentation Improvement (Signed)
THIS DOCUMENT IS NOT A PERMANENT PART OF THE MEDICAL RECORD  Please update your documentation with the medical record to reflect your response to this query. If you need help knowing how to do this please call [phone].  03/16/13  Dear Dr. Curt Jews,   Noted in PN 7/23: "Dyspnea/ High output CHF, acute".  Please indicate in addendum/DC summary the type of CHF to more clearly indicate severity of illness and risk of mortality. Please indicate if this is Systolic, Diastolic, Combined. Thank you.  Reviewed:  no additional documentation provided  Thank You,  Beverley Fiedler RN BSN Clinical Documentation Specialist: [phone] Health Information Management [place]

## 2013-03-19 NOTE — Telephone Encounter (Signed)
Attempted to call pt x 2.  Left vm to return call.

## 2013-03-21 NOTE — Telephone Encounter (Signed)
appt scheduled.  i believe Zachary Mcguire spoke with patient yesterday.

## 2013-03-22 NOTE — Telephone Encounter (Signed)
Ok to do - if he has been set up with [facility] RN, please let her know.

## 2013-03-22 NOTE — Telephone Encounter (Signed)
Deidra with [facility] left v/m requesting written order for suction machine including exactly what is being order with doctor's NPI # and doctors signature on script. (Medicare requirement).

## 2013-03-22 NOTE — Telephone Encounter (Signed)
Yes. Appt has been scheduled. Patient is also requesting rigid suction for home use due to thick secretions. They used this on him at the hospital and it worked well. He would like this through [facility] if possible.

## 2013-03-22 NOTE — Telephone Encounter (Signed)
Needs order on script pad please. What I faxed isn't sufficient. I ordered rigid home suction unit for dx code 527.7. Thanks!

## 2013-03-23 ENCOUNTER — Encounter: Payer: Self-pay | Admitting: Family Medicine

## 2013-03-23 ENCOUNTER — Ambulatory Visit (INDEPENDENT_AMBULATORY_CARE_PROVIDER_SITE_OTHER): Payer: Medicare Other | Admitting: Family Medicine

## 2013-03-23 VITALS — BP 136/64 | HR 104 | Temp 98.6°F

## 2013-03-23 DIAGNOSIS — D6182 Myelophthisis: Secondary | ICD-10-CM

## 2013-03-23 DIAGNOSIS — R600 Localized edema: Secondary | ICD-10-CM

## 2013-03-23 DIAGNOSIS — D649 Anemia, unspecified: Secondary | ICD-10-CM

## 2013-03-23 DIAGNOSIS — N390 Urinary tract infection, site not specified: Secondary | ICD-10-CM | POA: Insufficient documentation

## 2013-03-23 DIAGNOSIS — IMO0002 Reserved for concepts with insufficient information to code with codable children: Secondary | ICD-10-CM

## 2013-03-23 DIAGNOSIS — E46 Unspecified protein-calorie malnutrition: Secondary | ICD-10-CM

## 2013-03-23 DIAGNOSIS — R32 Unspecified urinary incontinence: Secondary | ICD-10-CM

## 2013-03-23 DIAGNOSIS — C61 Malignant neoplasm of prostate: Secondary | ICD-10-CM

## 2013-03-23 DIAGNOSIS — B370 Candidal stomatitis: Secondary | ICD-10-CM | POA: Insufficient documentation

## 2013-03-23 LAB — CBC WITH DIFFERENTIAL/PLATELET
Basophils Absolute: 0 10*3/uL (ref 0.0–0.1)
Eosinophils Relative: 0.4 % (ref 0.0–5.0)
HCT: 25.3 % — ABNORMAL LOW (ref 39.0–52.0)
Lymphocytes Relative: 14 % (ref 12.0–46.0)
MCHC: 33.1 g/dL (ref 30.0–36.0)
MCV: 88.1 fl (ref 78.0–100.0)
Monocytes Relative: 3.3 % (ref 3.0–12.0)
Neutro Abs: 5.8 10*3/uL (ref 1.4–7.7)
Neutrophils Relative %: 82.2 % — ABNORMAL HIGH (ref 43.0–77.0)
RBC: 2.87 Mil/uL — ABNORMAL LOW (ref 4.22–5.81)

## 2013-03-23 MED ORDER — MIRTAZAPINE 15 MG PO TABS: 15.0000 mg | ORAL_TABLET | Freq: Every day | ORAL | Status: DC

## 2013-03-23 MED ORDER — NYSTATIN 100000 UNIT/ML MT SUSP: 5.0000 mL | Freq: Three times a day (TID) | OROMUCOSAL | Status: DC | PRN

## 2013-03-23 NOTE — Assessment & Plan Note (Signed)
Chronic, some improved s/p IV diuresis. Continue lasix 20mg  bid.

## 2013-03-23 NOTE — Assessment & Plan Note (Signed)
after abx use.  Treat with nystatin swish and swallow - sent to pharmacy. Discussed GI precautions.

## 2013-03-23 NOTE — Patient Instructions (Addendum)
Let's try mirtazapine 15mg  nightly instead of megace for appetite.  Let me know how you tolerate this medicine. Blood work today. Return to see me in 1 month.

## 2013-03-23 NOTE — Assessment & Plan Note (Addendum)
Discussed increased risk of blood clots with megace - pt/wife desire trial of another med. Start mirtazapine 15mg  nightly.  They will hold onto megace in case not tolerated well.

## 2013-03-23 NOTE — Assessment & Plan Note (Signed)
Found at hospital - currently finishing ceftin course.

## 2013-03-23 NOTE — Telephone Encounter (Signed)
Written order placed in Zachary Mcguire's box.

## 2013-03-23 NOTE — Progress Notes (Signed)
Subjective:    Patient ID: Zachary Mcguire, male    DOB: May 08, 1941, 72 y.o.   MRN: [id number]  HPI CC: hosp f/u  Pleasant but unfortunate 72 yo with h/o diffusely metastatic prostate cancer to bone presents as hospital f/u today.  Recent hospitalization for dypsnea, found to have anemia with hgb nadir of 6.5 s/p 2u pRBC, high output CHF 2/2 severe anemia (improved upon IV diuresis).  He was also found to have Ecoli UTI, treated with rocephin and sent home with ceftin of which he is completing course.  2D echo WNL during hospitalization.  Meds reviewed, compliant with meds. Denies current chest pain, dyspnea, fevers/chills, coughing.  Leg swelling improving.  R>L dry eyes for several weeks.  Some double vision.  Using warm compresses in the morning.  No redness or purulent drainage.  Bilateral leg pain present at mid thighs  - only when walking.  Does not ambulate.  Uses wheelchair regularly.  Normally sits in recliner at home.  Noted worse weakness since he's returned from hospital.  Using [facility] for [facility] needs - PT/OT.  Has had home safety evaluation by PT.  Requests rigid suction with suction machine 2/2 increased secretions.  Wt Readings from Last 3 Encounters:  03/16/13 151 lb 12.8 oz (68.856 kg)  03/06/13 151 lb 12 oz (68.833 kg)  07/31/12 180 lb (81.647 kg)    D/C Hgb 9.2 D/C Alb 2.9   Admit date: 03/13/2013  Discharge date: 03/16/2013  F/u phone call: 03/20/2013 Discharge Diagnoses:  Active Problems:  Prostate cancer metastatic to multiple sites  Constipation  Failure to thrive  Anemia  Dyspnea  Malignant neoplasm of prostate  Discharge Condition: improved/stable  Diet recommendation:  Filed Weights    03/14/13 0558  03/15/13 0630  03/16/13 0500   Weight:  68.856 kg (151 lb 12.8 oz)  68.584 kg (151 lb 3.2 oz)  68.856 kg (151 lb 12.8 oz)   History of present illness:  Zachary Mcguire is a 72 y.o. 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   He has never had any treatment for his Prostate CA for the last 10-15 years.  He has had several medical problems in the last year, progressive lower leg weakness, pain in his Leg especially R thigh/hip.  He presents to the ER today with dyspnea on exertion since yesterday.  He has also had lower extremity edema for 3-6 months.  Upon evaluation in there ER, he was noted to have Hb of 6.5, down from 7.2gm/dl 10 days ago.  No hematemesis, melena, hematochezia.  Hospital Course:  1. Dyspnea/ High output CHF, acute- Secondary to severe anemia  As discussed above upon admission the patient had a chest x-ray done which showed cardiomegaly without acute superimposed on cardiopulmonary disease, extensive cautious metastasis was noted. A d-dimer was done and was elevated. CTangio of chest was done and came back negative for PE. His BNP was done and was elevated at 889.4. He was started on IV Lasix on admission. he was noted to be severely anemic with a hemoglobin of 6.5. he was transfused 2 units of packed red blood cells. A 2D ECHO was done and showed EF 60-65% and no wall motion abnl the impression was that patient had high output CHF secondary to severe anemia. His symptoms improved with diuresis, was changed to oral Lasix and he is to continue this upon discharge  2. Anemia  -acute on chronic, transfuse 2 units PRBC  -most likely related to  metastatic Prostate CA  -Heme check in ER with trace heme positive brown stool  -no gross bleeding, hgb stable this am. No further GI indicated at this time  -He is to continue his iron supplementation as prior to admission and follow up with his PCP  3. Prostate Cancer with extensive bony metastasis  - Has declined chemotherapy and XRT in the past, and declined oncology consultation while in the hospital as he still does not wish to have any treatment for the cancer.  - Discussed hospice and palliative care consultation but wife not interested at this time, he is  to followup with his PCP.  -As noted above patient had significantly elevated d-dimer and CT and she was negative for PE, Dopplers of lower extremities negative for DVT. Significant chronic bilateral lower extremity weakness for over 1 year but L>R- LLE xray with bony mets.  -pt still does not want any treatment for Ca as above  -PT consulted and home health is PT recommended  4. E.COLI UTI  -Patient had urine cultures done on admission grew Escherichia coli, and he was started on Rocephin to which it is sensitive. He'll be discharged on Ceftin to complete the antibiotic course his to followup with his PCP  5. Constipation/impaction  - Patient was placed on bowel regimen while in the hospital and also given an enema prior to discharge.Continue bowel regimen- Senokot/MiraLAX upon discharge.  Consultants:  none Procedures:  echo Study Conclusions - Left ventricle: The cavity size was normal. Wall thickness was increased in a pattern of mild LVH. Systolic function was normal. The estimated ejection fraction was in the range of 60% to 65%. Wall motion was normal; there were no regional wall motion abnormalities. - Aortic valve: There was very mild stenosis. - Mitral valve: Calcified annulus. - Left atrium: The atrium was moderately dilated. - Atrial septum: No defect or patent foramen ovale was identified. - Pulmonary arteries: Systolic pressure was moderately increased. PA peak pressure: 53mm Hg (S). Transthoracic echocardiography.  Doppler US neg for DVT   Dg Chest 2 View  03/13/2013 *RADIOLOGY REPORT* Clinical Data: Shortness of breath. Prostate cancer. CHEST - 2 VIEW Comparison: 02/18/2012 Findings: Extensive sclerotic osseous metastasis. Progressive since 02/18/2012. Midline trachea. Normal heart size. Aortic atherosclerosis. No pleural effusion or pneumothorax. Clear lungs. IMPRESSION: Cardiomegaly, without acute superimposed cardiopulmonary disease. Extensive osseous metastasis.  Progressive since 02/18/2012. Original Report Authenticated By: Jeronimo Greaves, M.D.  Dg Tibia/fibula Left  03/13/2013 *RADIOLOGY REPORT* Clinical Data: Left leg swelling and left knee pain. History of metastatic prostate cancer. LEFT TIBIA AND FIBULA - 2 VIEW Comparison: Left femur 12/22/2012 Findings: Heterogeneous bone sclerosis involving the distal left femoral metaphysis, the proximal tibial metaphysis, and the proximal fibula consistent with history of metastatic prostate cancer. Focal area of vague sclerosis along the mid shaft of the fibula is probably also metastatic. Growth arrest line in the distal tibial metaphysis. No evidence of acute fracture or subluxation. Soft tissue calcifications are likely phleboliths. IMPRESSION: Metastatic changes in the distal left femur, tibia and fibula as described. No acute fractures demonstrated. Original Report Authenticated By: Burman Nieves, M.D.  Ct Angio Chest Pe W/cm &/or Wo Cm  03/13/2013 *RADIOLOGY REPORT* Clinical Data: Cough, shortness of breath and weakness. History of metastatic prostate carcinoma to bone. CT ANGIOGRAPHY CHEST Technique: Multidetector CT imaging of the chest using the standard protocol during bolus administration of intravenous contrast. Multiplanar reconstructed images including MIPs were obtained and reviewed to evaluate the vascular anatomy. Contrast: 60mL OMNIPAQUE IOHEXOL  350 MG/ML SOLN Comparison: None. Findings: The pulmonary arteries are well opacified. There is no evidence of acute pulmonary embolism. There is a small right-sided pleural effusion with associated atelectasis of the posterior right lower lobe. Effusion appears to be uncomplicated and is not associated with obvious pleural mass or peripheral pleural enhancement. There is no evidence of pulmonary infiltrate, nodule or edema. Heart size is within normal limits. No lymphadenopathy is identified in the chest. Extensive bony metastatic disease is again identified assessed  involving the entire bony thorax with a sclerotic lesions identified. There is no evidence to suggest a pathologic fracture. IMPRESSION: 1. No evidence of pulmonary embolism. 2. Small right pleural effusion. 3. Extensive sclerotic bony metastatic disease. Original Report Authenticated By: Irish Lack, M.D.    Medications and allergies reviewed and updated in chart.  Past histories reviewed and updated if relevant as below. Patient Active Problem List   Diagnosis Date Noted  . Dyspnea 03/13/2013  . Malignant neoplasm of prostate 03/13/2013  . Anemia 03/08/2013  . Failure to thrive 03/06/2013  . Pedal edema 03/06/2013  . Prostate cancer metastatic to multiple sites   . HLD (hyperlipidemia)   . H/O gastric ulcer   . Urine incontinence   . Asthma   . Constipation   . Obstructive sleep apnea   . Leg length discrepancy 08/04/2012   Past Medical History  Diagnosis Date  . Prostate cancer metastatic to multiple sites   . HLD (hyperlipidemia)   . H/O gastric ulcer   . Urine incontinence     self catheterizes  . Hx: UTI (urinary tract infection)   . H/O prostatitis   . History of thrush   . [facility] spotted fever) 2012    history  . Asthma   . Constipation   . Obstructive sleep apnea 2012    sleep study showing mod to severe OSA with AHI 47   Past Surgical History  Procedure Laterality Date  . Anal fissure repair     History  Substance Use Topics  . Smoking status: Never Smoker   . Smokeless tobacco: Never Used  . Alcohol Use: No   Family History  Problem Relation Age of Onset  . Arthritis Sister   . Cancer Neg Hx   . CAD Neg Hx   . Stroke Neg Hx   . Diabetes Neg Hx    Allergies  Allergen Reactions  . Voltaren (Diclofenac Sodium) Other (See Comments)    hallucinations   Current Outpatient Prescriptions on File Prior to Visit  Medication Sig Dispense Refill  . ALPRAZolam (XANAX) 0.5 MG tablet Take 0.5 mg by mouth See admin instructions. Take 1 tablet  every morning scheduled and take 1 tablet every evening as needed for restless legs and anxiety      . cefUROXime (CEFTIN) 500 MG tablet Take 1 tablet (500 mg total) by mouth 2 (two) times daily.  10 tablet  0  . ferrous sulfate 325 (65 FE) MG tablet Take 325 mg by mouth daily.      . furosemide (LASIX) 20 MG tablet Take 20 mg by mouth 2 (two) times daily.       . Iron-Vitamins (S.S.S. TONIC) LIQD Take 30 mLs by mouth daily.      Marland Kitchen ketoconazole (NIZORAL) 2 % cream Apply 1 application topically daily as needed (for itching).      . megestrol (MEGACE) 40 MG/ML suspension Take 5 mLs (200 mg total) by mouth daily.  240 mL  5  .  nystatin (MYCOSTATIN) 100000 UNIT/ML suspension Take 5 mLs (500,000 Units total) by mouth 3 (three) times daily as needed (as needed for thrush).  75 mL  0  . OVER THE COUNTER MEDICATION Take 15 mLs by mouth daily. Black strap (iron supplement)      . polyethylene glycol powder (GLYCOLAX/MIRALAX) powder Take 17 g by mouth daily as needed (constipation).  3350 g  1  . potassium chloride (K-DUR) 10 MEQ tablet Take 1 tablet (10 mEq total) by mouth daily.  30 tablet  0  . tetrahydrozoline (VISINE) 0.05 % ophthalmic solution Place 1 drop into both eyes 2 (two) times daily as needed (for allergies).      . traMADol (ULTRAM-ER) 100 MG 24 hr tablet Take 100 mg by mouth daily as needed for pain.       No current facility-administered medications on file prior to visit.     Review of Systems Per HPI    Objective:   Physical Exam  Nursing note and vitals reviewed. Constitutional: He appears well-developed and well-nourished. No distress.  tired  HENT:  Head: Normocephalic and atraumatic.  Mouth/Throat: Uvula is midline and oropharynx is clear and moist. No oropharyngeal exudate, posterior oropharyngeal edema, posterior oropharyngeal erythema or tonsillar abscesses.  yellowish coating on tongue  Eyes: Conjunctivae and EOM are normal. Pupils are equal, round, and reactive to  light. Right eye exhibits no discharge. Left eye exhibits no discharge. Right conjunctiva is not injected. Left conjunctiva is not injected. No scleral icterus.  Conjunctival pallor  Neck: Normal range of motion. Neck supple.  Cardiovascular: Normal rate, regular rhythm and intact distal pulses.   Murmur (flow SEM) heard. Pulmonary/Chest: Effort normal and breath sounds normal. No respiratory distress. He has no wheezes. He has no rales.  Musculoskeletal: He exhibits edema (2+ LLE, tr-1+ RLE).  Skin: There is pallor.       Assessment & Plan:

## 2013-03-23 NOTE — Assessment & Plan Note (Signed)
Continue I/O caths 4-5 x/day

## 2013-03-23 NOTE — Telephone Encounter (Signed)
Order faxed as requested

## 2013-03-23 NOTE — Assessment & Plan Note (Addendum)
Anticipated anemia due to bone marrow infiltration, however hemoccult was mildly positive on recent hospitalization. Continue iron supplementation daily. Discussed referral to heme/onc for further evaluation and consideration of transfusion vs IV iron vs other, pt declines, prefers to await results of CBC today and if stable will not want further eval.

## 2013-03-23 NOTE — Assessment & Plan Note (Signed)
Coming to understand dx.  Continues to decline referral to heme/onc for palliative therapy. We also discussed hospice and palliative care options, and difference between either.  He had bad experience with hospice with family member's illness. Desires no change in therapy today, will continue tramadol PRN pain. RTC 1 mo for f/u.

## 2013-03-27 ENCOUNTER — Ambulatory Visit (INDEPENDENT_AMBULATORY_CARE_PROVIDER_SITE_OTHER): Payer: Medicare Other | Admitting: Family Medicine

## 2013-03-27 ENCOUNTER — Encounter: Payer: Self-pay | Admitting: Family Medicine

## 2013-03-27 VITALS — BP 114/64 | HR 116 | Temp 98.1°F

## 2013-03-27 DIAGNOSIS — R600 Localized edema: Secondary | ICD-10-CM

## 2013-03-27 DIAGNOSIS — R609 Edema, unspecified: Secondary | ICD-10-CM

## 2013-03-27 DIAGNOSIS — C800 Disseminated malignant neoplasm, unspecified: Secondary | ICD-10-CM

## 2013-03-27 DIAGNOSIS — D6182 Myelophthisis: Secondary | ICD-10-CM

## 2013-03-27 DIAGNOSIS — C61 Malignant neoplasm of prostate: Secondary | ICD-10-CM

## 2013-03-27 DIAGNOSIS — K5900 Constipation, unspecified: Secondary | ICD-10-CM

## 2013-03-27 DIAGNOSIS — B370 Candidal stomatitis: Secondary | ICD-10-CM

## 2013-03-27 MED ORDER — NYSTATIN 100000 UNIT/ML MT SUSP: 5.0000 mL | Freq: Three times a day (TID) | OROMUCOSAL | Status: AC | PRN

## 2013-03-27 NOTE — Patient Instructions (Addendum)
Blood work today - if worsening shortness of breath, please let me know (call me) for blood transfusion. Hospice information provided today - hospice and palliative care of [facility]. Return to see me in 2 weeks, sooner if needed.

## 2013-03-27 NOTE — Progress Notes (Signed)
  Subjective:    Patient ID: Zachary Mcguire, male    DOB: 21-Mar-1941, 72 y.o.   MRN: [id number]  HPI CC: f/u dyspnea  Friday when I saw patient, was a good day. This morning felt more tired, more dyspneic.  See result note.  Wanted to be evaluated again today.  Interested in hospital bed and hoyer lift.  Saw physical therapist today - concerned with tachycardia.  Denies fevers/chills, abd pain, diarrhea.  Stays constipated - stools every other day.  Using miralax and colace as needed.  Cough present for about a week, sputum present and gets stuck in throat.  Very happy with rigid suction use.  Eating some better.  Wants to avoid strong narcotics 2/2 constipation.  Thrush - nystatin swish/swallow helping with taste.  requests refill  Progressive decline since 03/2011.  Generalized weakness since discharge from hospital - has HHPT coming out to house.  Endorses 21 year h/o prostate cancer today, states he has made a conscious choice to avoid treatment 2/2 concern for ED and other side effects.  Self-catheterizing 3-4x/day  Wt Readings from Last 3 Encounters:  03/16/13 151 lb 12.8 oz (68.856 kg)  03/06/13 151 lb 12 oz (68.833 kg)  07/31/12 180 lb (81.647 kg)    Past Medical History  Diagnosis Date  . Prostate cancer metastatic to multiple sites   . HLD (hyperlipidemia)   . H/O gastric ulcer   . Urine incontinence     self catheterizes  . Hx: UTI (urinary tract infection)   . H/O prostatitis   . History of thrush   . RMSF [facility] spotted fever) 2012    history  . Asthma   . Constipation   . Obstructive sleep apnea 2012    sleep study showing mod to severe OSA with AHI 47     Review of Systems Per HPI    Objective:   Physical Exam  Nursing note and vitals reviewed. Constitutional: No distress.  thin  HENT:  Head: Normocephalic and atraumatic.  Mouth/Throat: Oropharynx is clear and moist. No oropharyngeal exudate.  Eyes: Conjunctivae and EOM are normal. Pupils  are equal, round, and reactive to light. No scleral icterus.  Neck: Normal range of motion. Neck supple.  Cardiovascular: Regular rhythm, normal heart sounds and intact distal pulses.  Tachycardia present.   No murmur heard. Pulmonary/Chest: Effort normal and breath sounds normal. No respiratory distress. He has no decreased breath sounds. He has no wheezes. He has no rhonchi. He has no rales.  Upper airway transmission of secretions rattling cough  Abdominal: Soft. Normal appearance and bowel sounds are normal. He exhibits no distension and no mass. There is no hepatosplenomegaly. There is tenderness (MSK pain) in the left lower quadrant. There is no rebound, no guarding and no CVA tenderness.  Musculoskeletal: He exhibits edema (baseline 2+ RLE edema).  Lymphadenopathy:    He has no cervical adenopathy.  Skin: Skin is warm and dry.       Assessment & Plan:

## 2013-03-28 ENCOUNTER — Encounter: Payer: Self-pay | Admitting: Family Medicine

## 2013-03-28 ENCOUNTER — Telehealth: Payer: Self-pay | Admitting: Family Medicine

## 2013-03-28 DIAGNOSIS — R531 Weakness: Secondary | ICD-10-CM | POA: Insufficient documentation

## 2013-03-28 LAB — CBC WITH DIFFERENTIAL/PLATELET
Eosinophils Relative: 0.7 % (ref 0.0–5.0)
HCT: 26.9 % — ABNORMAL LOW (ref 39.0–52.0)
Hemoglobin: 8.8 g/dL — ABNORMAL LOW (ref 13.0–17.0)
Lymphs Abs: 1 10*3/uL (ref 0.7–4.0)
MCV: 88.1 fl (ref 78.0–100.0)
Monocytes Relative: 2.5 % — ABNORMAL LOW (ref 3.0–12.0)
Neutro Abs: 7.3 10*3/uL (ref 1.4–7.7)
WBC: 8.7 10*3/uL (ref 4.5–10.5)

## 2013-03-28 NOTE — Assessment & Plan Note (Signed)
Stable with miralax powder.

## 2013-03-28 NOTE — Telephone Encounter (Signed)
Just FYI, pt's wife came by this morning asking for copies of pt's labs, gave her copy of the 03/06/13 & 03/27/13 labs. Wife is on the designated party release.

## 2013-03-28 NOTE — Assessment & Plan Note (Addendum)
A total of 45 minutes were spent face-to-face with the patient during this encounter and over half of that time was spent on counseling and coordination of care Majority of visit spent discussing goals of care and plan from here.   Pt's goal is to stay at home.  Declines heme/onc evaluation. Discussed role of hospice and palliative care in his case and I did recommend hospice referral today.   Wife would like to further look into this, aware to let me know if desires referral.  I provided them with [email or web address] website. I will order hospital bed given progressively worsening weakness. rtc 1 mo for f/u.

## 2013-03-28 NOTE — Assessment & Plan Note (Signed)
Recheck CBC today. Continues to decline heme/onc referral. Discussed short term strategy of blood transfusion - if worsening dyspnea or if significant drop in Hgb today, will schedule through short stay.

## 2013-03-28 NOTE — Assessment & Plan Note (Signed)
Improved with nystatin swish and swallow

## 2013-03-28 NOTE — Assessment & Plan Note (Signed)
continue lasix 20mg  bid.

## 2013-03-28 NOTE — Telephone Encounter (Addendum)
Written script for hospital bed placed in Kim's box.  Plz fax to [facility]. I also called patient today for an update on how he was feeling - spoke with wife.  Doing better today, not markedly short winded, trying to eat more. Discussed CBC results - Hgb stable at 8.8. I gave verbal order to [facility] with [facility] over the phone for RN eval and assessment.

## 2013-03-30 DIAGNOSIS — M6281 Muscle weakness (generalized): Secondary | ICD-10-CM

## 2013-03-30 DIAGNOSIS — R269 Unspecified abnormalities of gait and mobility: Secondary | ICD-10-CM

## 2013-03-30 DIAGNOSIS — IMO0001 Reserved for inherently not codable concepts without codable children: Secondary | ICD-10-CM

## 2013-03-30 DIAGNOSIS — C61 Malignant neoplasm of prostate: Secondary | ICD-10-CM

## 2013-04-10 ENCOUNTER — Ambulatory Visit: Payer: Medicare Other | Admitting: Family Medicine

## 2013-04-10 ENCOUNTER — Telehealth: Payer: Self-pay

## 2013-04-10 NOTE — Telephone Encounter (Signed)
Ok to do. Thank you.  

## 2013-04-10 NOTE — Telephone Encounter (Signed)
Stacy notified. 

## 2013-04-10 NOTE — Telephone Encounter (Signed)
Stacy PT with [facility] request verbal order for PT 2-3 times a week for 3 weeks. Please advise.

## 2013-04-12 ENCOUNTER — Ambulatory Visit: Payer: Medicare Other | Admitting: Family Medicine

## 2013-04-14 DIAGNOSIS — C61 Malignant neoplasm of prostate: Secondary | ICD-10-CM

## 2013-04-14 DIAGNOSIS — E46 Unspecified protein-calorie malnutrition: Secondary | ICD-10-CM

## 2013-04-14 DIAGNOSIS — N390 Urinary tract infection, site not specified: Secondary | ICD-10-CM

## 2013-04-14 DIAGNOSIS — B370 Candidal stomatitis: Secondary | ICD-10-CM

## 2013-04-14 DIAGNOSIS — R609 Edema, unspecified: Secondary | ICD-10-CM

## 2013-04-14 DIAGNOSIS — C800 Disseminated malignant neoplasm, unspecified: Secondary | ICD-10-CM

## 2013-04-14 DIAGNOSIS — D6182 Myelophthisis: Secondary | ICD-10-CM

## 2013-04-14 DIAGNOSIS — R32 Unspecified urinary incontinence: Secondary | ICD-10-CM

## 2013-04-19 ENCOUNTER — Telehealth: Payer: Self-pay

## 2013-04-19 MED ORDER — HYDROCODONE-ACETAMINOPHEN 5-325 MG PO TABS: 1.0000 | ORAL_TABLET | Freq: Three times a day (TID) | ORAL | Status: DC | PRN

## 2013-04-19 NOTE — Telephone Encounter (Signed)
Mrs Pavel request change of pain med from Tramadol; Tramadol takes a long time to be effective for pain and relief wears off quickly.Pt has pain in arms, hips and leg when moves and during physical therapy(pt has bone CA). Mrs Dettmer said pt has appt 09/*02/14 to discuss pt taking morphine for pain.  [facility] [address] Mrs Noreen request cb.

## 2013-04-19 NOTE — Telephone Encounter (Signed)
Attempted to call patient/wife. No answer and mailboxes were full. Unable to leave message. Will try again later.

## 2013-04-19 NOTE — Telephone Encounter (Signed)
If he'd like we can do a stronger pain medication like hydrocodone/acetaminophen - if pt/wife desire, we can phone this in to his pharmacy (ordered in chart)

## 2013-04-20 ENCOUNTER — Ambulatory Visit
Admission: RE | Admit: 2013-04-20 | Discharge: 2013-04-20 | Disposition: A | Discharge status: Home / Self Care | Payer: Medicare Other | Source: Ambulatory Visit | Attending: Family Medicine | Admitting: Family Medicine

## 2013-04-20 ENCOUNTER — Encounter: Payer: Self-pay | Admitting: Family Medicine

## 2013-04-20 ENCOUNTER — Ambulatory Visit (INDEPENDENT_AMBULATORY_CARE_PROVIDER_SITE_OTHER): Payer: Medicare Other | Admitting: Family Medicine

## 2013-04-20 VITALS — BP 130/60 | HR 104 | Temp 97.8°F

## 2013-04-20 DIAGNOSIS — M79609 Pain in unspecified limb: Secondary | ICD-10-CM

## 2013-04-20 DIAGNOSIS — D6182 Myelophthisis: Secondary | ICD-10-CM

## 2013-04-20 DIAGNOSIS — C800 Disseminated malignant neoplasm, unspecified: Secondary | ICD-10-CM

## 2013-04-20 DIAGNOSIS — L8990 Pressure ulcer of unspecified site, unspecified stage: Secondary | ICD-10-CM

## 2013-04-20 DIAGNOSIS — C61 Malignant neoplasm of prostate: Secondary | ICD-10-CM

## 2013-04-20 DIAGNOSIS — M79601 Pain in right arm: Secondary | ICD-10-CM

## 2013-04-20 DIAGNOSIS — R531 Weakness: Secondary | ICD-10-CM

## 2013-04-20 DIAGNOSIS — H532 Diplopia: Secondary | ICD-10-CM

## 2013-04-20 DIAGNOSIS — E46 Unspecified protein-calorie malnutrition: Secondary | ICD-10-CM

## 2013-04-20 DIAGNOSIS — R5381 Other malaise: Secondary | ICD-10-CM

## 2013-04-20 MED ORDER — HYDROCODONE-ACETAMINOPHEN 5-325 MG PO TABS: 1.0000 | ORAL_TABLET | Freq: Three times a day (TID) | ORAL | Status: AC | PRN

## 2013-04-20 MED ORDER — MIRTAZAPINE 15 MG PO TABS: 15.0000 mg | ORAL_TABLET | Freq: Every day | ORAL | Status: AC

## 2013-04-20 NOTE — Progress Notes (Signed)
  Subjective:    Patient ID: Zachary Mcguire, male    DOB: [birth year], 72 y.o.   MRN: [id number]  HPI CC: f/u, discuss R arm pain  Concern for R arm pain - worried may be fractured.  Went to [employer] to pick up some new eyeglasses - on the way to the car, asked a young man to help him get into car - picked pt up, and placed him in the car but R arm got stuck behind him.  Since then significant pain at R upper arm. Unable to raise arm 2/2 pain.   Persistent leg pain with walking and standing, but no pain at rest.  Asks about [facility] bus to drive him in to appointments.  Has fear of falling. HHPT continues to work with patient.  Double vision - started 2 weeks ago.  Recently saw Dr. Marcha Solders ophthalmologist. Noted some slurring of speech over the last 2 weeks as well.  Denies dyspnea, chest pain/pressure, dizziness or headaches. Lab Results  Component Value Date   CREATININE 0.91 03/16/2013    Past Medical History  Diagnosis Date  . Prostate cancer metastatic to multiple sites   . HLD (hyperlipidemia)   . H/O gastric ulcer   . Urine incontinence     self catheterizes  . Hx: UTI (urinary tract infection)   . H/O prostatitis   . History of thrush   . RMSF [facility] spotted fever) 2012    history  . Asthma   . Constipation   . Obstructive sleep apnea 2012    sleep study showing mod to severe OSA with AHI 47     Review of Systems Per HPI    Objective:   Physical Exam  Nursing note and vitals reviewed. Constitutional:  Cachectic, generalized weakness, in wheelchair unable to stand up without assistance  Eyes:  Double vision  Cardiovascular: Regular rhythm, normal heart sounds and intact distal pulses.  Tachycardia present.   No murmur heard. Pulmonary/Chest: Effort normal and breath sounds normal. No respiratory distress. He has no wheezes. He has no rales.  Musculoskeletal: He exhibits edema (trace pitting edema bilaterally).  L arm WNL R arm -  Keeps arm in  internal rotation with elbow flexed and against body, any movement of arm causes severe pain. No pain or significant deformity with palpation of shoulder. Point tender with palpation of lower arm above elbow. Some soft tissue swelling distal upper lateral arm just above elbow, marked tenderness to palpation there. Lesser pain with palpation of distal humerus. 2+ rad pulses.  Skin: Skin is warm and dry. No rash noted. No erythema.  No breaks in skin       Assessment & Plan:

## 2013-04-20 NOTE — Patient Instructions (Addendum)
Take hydrocodone / acetaminophen for pain - 1 to 2 tablets every 8 hours as needed for pain. Xray today - we will call you if there is a fracture - unable to complete. I will ask hospice to come out to your house next week to discuss more help.

## 2013-04-20 NOTE — Telephone Encounter (Signed)
Spoke with patient's wife. She was fine with starting hydrocodone, but needed to move follow up to today due to death in her family so she could attend funeral next week. They will discuss it with you then and get Rx at that time.

## 2013-04-21 ENCOUNTER — Encounter: Payer: Self-pay | Admitting: Family Medicine

## 2013-04-21 DIAGNOSIS — M79601 Pain in right arm: Secondary | ICD-10-CM | POA: Insufficient documentation

## 2013-04-21 DIAGNOSIS — H532 Diplopia: Secondary | ICD-10-CM | POA: Insufficient documentation

## 2013-04-21 NOTE — Assessment & Plan Note (Signed)
Acute worsening in condition over last 2 weeks.  Pt's goal remains to stay at home.  Has declined heme/onc eval in past. They have been very hesitant to incorporate hospice into their care - bad experience with one of their family members. Wife has questions about financial aspects of hospice care. They agree to have hospice reach out to them to discuss possible transition to hospice /palliative care.

## 2013-04-21 NOTE — Assessment & Plan Note (Signed)
Sustained after injury 1 wk ago. Bone fracture vs soft tissue contusion. I don't think there's a shoulder dislocation or a DVT today. We attempted xray of R humerus to r/o fracture in known h/o metastatic prostate carcinoma with bone involvement, but pt was too weak and in too much pain to be able to even temporarily stand for AP xray. Will treat with increase in pain medication - will prescribe hydrocodone 5/325 1-2 tablets every 8 hours as needed, script for #60 provided today Pt is relatively narcotic-naive and worried about AMS with stronger narcotics.

## 2013-04-21 NOTE — Assessment & Plan Note (Addendum)
Obvious concern is brain involvement of metastatic prostate cancer - discussed this. I feel head CT would be excessive burden on patient in current state.  Will work towards better pain control for now.

## 2013-04-21 NOTE — Assessment & Plan Note (Addendum)
Acutely worsening over last 2 weeks - now has hospital bed. Would qualify for [facility] transportation - transportation in general is becoming dangerous and an overwhelming burden on family and patient. Will start with hospice referral today - see below.

## 2013-04-21 NOTE — Assessment & Plan Note (Signed)
Seems relatively stable from symptomatic perspective - I did not check blood work today.

## 2013-04-21 NOTE — Assessment & Plan Note (Signed)
Has not started remeron yet (wife lost med).

## 2013-04-24 ENCOUNTER — Emergency Department (HOSPITAL_COMMUNITY)
Admission: EM | Admit: 2013-04-24 | Discharge: 2013-04-25 | Disposition: A | Discharge status: Home / Self Care | Payer: Medicare Other | Attending: Emergency Medicine | Admitting: Emergency Medicine

## 2013-04-24 ENCOUNTER — Encounter (HOSPITAL_COMMUNITY): Payer: Self-pay | Admitting: *Deleted

## 2013-04-24 ENCOUNTER — Ambulatory Visit: Payer: Medicare Other | Admitting: Family Medicine

## 2013-04-24 ENCOUNTER — Emergency Department (HOSPITAL_COMMUNITY): Payer: Medicare Other

## 2013-04-24 DIAGNOSIS — Z87440 Personal history of urinary (tract) infections: Secondary | ICD-10-CM | POA: Insufficient documentation

## 2013-04-24 DIAGNOSIS — L8990 Pressure ulcer of unspecified site, unspecified stage: Secondary | ICD-10-CM | POA: Insufficient documentation

## 2013-04-24 DIAGNOSIS — C50919 Malignant neoplasm of unspecified site of unspecified female breast: Secondary | ICD-10-CM | POA: Insufficient documentation

## 2013-04-24 DIAGNOSIS — L89109 Pressure ulcer of unspecified part of back, unspecified stage: Secondary | ICD-10-CM | POA: Insufficient documentation

## 2013-04-24 DIAGNOSIS — Z8546 Personal history of malignant neoplasm of prostate: Secondary | ICD-10-CM | POA: Insufficient documentation

## 2013-04-24 DIAGNOSIS — Z8719 Personal history of other diseases of the digestive system: Secondary | ICD-10-CM | POA: Insufficient documentation

## 2013-04-24 DIAGNOSIS — L89152 Pressure ulcer of sacral region, stage 2: Secondary | ICD-10-CM

## 2013-04-24 DIAGNOSIS — W2209XA Striking against other stationary object, initial encounter: Secondary | ICD-10-CM | POA: Insufficient documentation

## 2013-04-24 DIAGNOSIS — S42301A Unspecified fracture of shaft of humerus, right arm, initial encounter for closed fracture: Secondary | ICD-10-CM

## 2013-04-24 DIAGNOSIS — G4733 Obstructive sleep apnea (adult) (pediatric): Secondary | ICD-10-CM | POA: Insufficient documentation

## 2013-04-24 DIAGNOSIS — Z8619 Personal history of other infectious and parasitic diseases: Secondary | ICD-10-CM | POA: Insufficient documentation

## 2013-04-24 DIAGNOSIS — S42409A Unspecified fracture of lower end of unspecified humerus, initial encounter for closed fracture: Secondary | ICD-10-CM | POA: Insufficient documentation

## 2013-04-24 DIAGNOSIS — Z862 Personal history of diseases of the blood and blood-forming organs and certain disorders involving the immune mechanism: Secondary | ICD-10-CM | POA: Insufficient documentation

## 2013-04-24 DIAGNOSIS — Y9389 Activity, other specified: Secondary | ICD-10-CM | POA: Insufficient documentation

## 2013-04-24 DIAGNOSIS — J45909 Unspecified asthma, uncomplicated: Secondary | ICD-10-CM | POA: Insufficient documentation

## 2013-04-24 DIAGNOSIS — D6182 Myelophthisis: Secondary | ICD-10-CM | POA: Insufficient documentation

## 2013-04-24 DIAGNOSIS — Z8639 Personal history of other endocrine, nutritional and metabolic disease: Secondary | ICD-10-CM | POA: Insufficient documentation

## 2013-04-24 DIAGNOSIS — Z79899 Other long term (current) drug therapy: Secondary | ICD-10-CM | POA: Insufficient documentation

## 2013-04-24 DIAGNOSIS — Y9289 Other specified places as the place of occurrence of the external cause: Secondary | ICD-10-CM | POA: Insufficient documentation

## 2013-04-24 DIAGNOSIS — L8992 Pressure ulcer of unspecified site, stage 2: Secondary | ICD-10-CM | POA: Insufficient documentation

## 2013-04-24 MED ORDER — OXYCODONE-ACETAMINOPHEN 5-325 MG PO TABS
1.0000 | ORAL_TABLET | Freq: Once | ORAL | Status: AC
  Administered 2013-04-24: 1 via ORAL
  Filled 2013-04-24: qty 1

## 2013-04-24 MED ORDER — OXYCODONE-ACETAMINOPHEN 5-325 MG PO TABS: 1.0000 | ORAL_TABLET | ORAL | Status: AC | PRN

## 2013-04-24 NOTE — ED Notes (Signed)
Per pt report: pt from home:  Pt c/o of right arm pain.  EMS did not note any deformity or swelling.  4 days ago, pt was being transferred in a car and car door shut on pt's right arm.  Pt was seen at [facility] but they were not able to do an x-ray d/t pt's weakness.  Pt was sent home with an arm sling and pain medication. Pt is bedbound and has a bedsore on bottom and left heel. Pt a/o x 4.  Skin warm and dry.  Pt hx of bone cancer. EMS VS: BP: 146/70, HR: 108, RR: 18

## 2013-04-24 NOTE — ED Provider Notes (Signed)
CSN: [id number]     Arrival date & time 04/24/13  2054 History   First MD Initiated Contact with Patient 04/24/13 2138     Chief Complaint  Patient presents with  . Arm Pain   (Consider location/radiation/quality/duration/timing/severity/associated sxs/prior Treatment) HPI Zachary Mcguire is a 72 y.o. male who presents to emergency department with complaint of a injury to the right arm which occurred 5 days ago. Patient states he has metastatic disease including his mom, states was getting into a car assisted by family member, but his right arm got stuck behind him and got hit on the door. Patient states he now has a "knot" in the right upper arm which is very tender. Patient states he has pain with movement of the right shoulder and right elbow. Patient denies seeing anyone for this however based on record reviewed it appears that he went to see his primary care physician 4 days ago but they were unable to do an x-ray in the office. Patient was started on Vicodin for his pain and given a sling. They did question fracture versus just a contusion. Patient denies any weakness or numbness in his hand. No other injuries. States Vicodin is not relieving his pain.  Past Medical History  Diagnosis Date  . Prostate cancer metastatic to multiple sites   . HLD (hyperlipidemia)   . H/O gastric ulcer   . Urine incontinence     self catheterizes  . Hx: UTI (urinary tract infection)   . H/O prostatitis   . History of thrush   . RMSF [facility] spotted fever) 2012    history  . Asthma   . Constipation   . Obstructive sleep apnea 2012    sleep study showing mod to severe OSA with AHI 47  . 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 Anal fissure repair     Family History  Problem Relation Age of Onset  . Arthritis Sister   . Cancer Neg Hx   . CAD Neg Hx   . Stroke Neg Hx   . Diabetes Neg Hx    History  Substance Use  Topics  . Smoking status: Never Smoker   . Smokeless tobacco: Never Used  . Alcohol Use: No    Review of Systems  Constitutional: Negative for fever and chills.  HENT: Negative for neck pain and neck stiffness.   Respiratory: Negative for cough, chest tightness and shortness of breath.   Cardiovascular: Negative for chest pain, palpitations and leg swelling.  Genitourinary: Negative for dysuria, urgency, frequency and hematuria.  Musculoskeletal: Positive for joint swelling and arthralgias.  Allergic/Immunologic: Negative for immunocompromised state.  Neurological: Negative for weakness and numbness.    Allergies  Voltaren  Home Medications   Current Outpatient Rx  Name  Route  Sig  Dispense  Refill  . HYDROcodone-acetaminophen (NORCO/VICODIN) 5-325 MG per tablet   Oral   Take 1-2 tablets by mouth every 8 (eight) hours as needed for pain.   60 tablet   0   . ketoconazole (NIZORAL) 2 % cream   Topical   Apply 1 application topically daily as needed (for itching).         . nystatin (MYCOSTATIN) 100000 UNIT/ML suspension   Oral   Take 5 mLs (500,000 Units total) by mouth 3 (three) times daily as needed (as needed for thrush).   300 mL   1   . ALPRAZolam (XANAX) 0.5 MG  tablet   Oral   Take 0.5 mg by mouth See admin instructions. Take 1 tablet every morning scheduled and take 1 tablet every evening as needed for restless legs and anxiety         . ferrous sulfate 325 (65 FE) MG EC tablet   Oral   Take 325 mg by mouth daily.         . furosemide (LASIX) 20 MG tablet   Oral   Take 20 mg by mouth 2 (two) times daily.          . Iron-Vitamins (S.S.S. TONIC) LIQD   Oral   Take 30 mLs by mouth daily.         . mirtazapine (REMERON) 15 MG tablet   Oral   Take 1 tablet (15 mg total) by mouth at bedtime.   30 tablet   3   . OVER THE COUNTER MEDICATION   Oral   Take 15 mLs by mouth daily. Black strap (iron supplement)         . polyethylene glycol  powder (GLYCOLAX/MIRALAX) powder   Oral   Take 17 g by mouth daily as needed (constipation).   3350 g   1   . potassium chloride (K-DUR) 10 MEQ tablet   Oral   Take 1 tablet (10 mEq total) by mouth daily.   30 tablet   0   . tetrahydrozoline (VISINE) 0.05 % ophthalmic solution   Both Eyes   Place 1 drop into both eyes 2 (two) times daily as needed (for allergies).         . traMADol (ULTRAM-ER) 100 MG 24 hr tablet   Oral   Take 100 mg by mouth daily as needed for pain.          BP 168/61  Pulse 100  Temp(Src) 98.4 F (36.9 C) (Oral)  Resp 20  SpO2 95% Physical Exam  Nursing note and vitals reviewed. Constitutional: He appears well-developed and well-nourished. No distress.  HENT:  Head: Normocephalic and atraumatic.  Genitourinary:  4x4 cm sacral decubitus ulcer, stage 2  Musculoskeletal:  There is swelling and mild deformity in the right mid humerus.. Very tender to palpation. Pain with elbow and shoulder range of motion. Distal radial pulses are normal and equal bilaterally. Equal grip strength and 5 out of 5 bilaterally. Normal sensation in distal arm.  Neurological: He is alert.  Skin: Skin is warm and dry.    ED Course  Procedures (including critical care time) Labs Review Labs Reviewed - No data to display Imaging Review Dg Humerus Right  04/24/2013   *RADIOLOGY REPORT*  Clinical Data: Arm pain, correlation with any chronic or  RIGHT HUMERUS - 2+ VIEW  Comparison: None.  Findings: The visualized osseous structures demonstrate a diffusely mottled appearance with heterogeneity of the cortex, concerning for widespread osseous metastases. There is a transverse fracture through the mid - distal right humeral shaft.  The right elbow and shoulder remain grossly intact.  Mild associate soft tissue swelling is present.  IMPRESSION: Diffuse osseous metastatic disease with acute fracture in the mid - distal right humerus.   Original Report Authenticated By: Rise Mu, M.D.    MDM   1. Humerus fracture, right, closed, initial encounter   2. Sacral decubitus ulcer, stage II     Patient with a right humerus fracture as described above. Results discussed with patient. Patient's splint applied for comfort. Will add Percocet instead of Vicodin for pain. Will give referral for  an orthopedics followup.  11:47 PM She had already discharged from the emergency department or call back in by a nurse. Patient wants [place] to look at his wound on his butt talk. Patient states that his mood has been there for months but is getting worse and states he forgot to show to his Dr. I looked at his sacrum and he's got a stage II approximately 4 x 4 centimeter decubitus ulcer. It does not appear to be infected. We'll clean out by nursing staff and will apply a dressing. Patient continues to question whether or not he can be admitted to the hospital because he is unable to walk his wife is having difficulty turning over in bed. Discussed with him that he does have hospice and home health care nurses that should be able to help him.   Filed Vitals:   04/24/13 2124 04/24/13 2129  BP:  168/61  Pulse: 100 100  Temp: 98.4 F (36.9 C) 98.4 F (36.9 C)  TempSrc: Oral Oral  Resp: 20 20  SpO2:  95%     Lottie Mussel, PA-C 04/25/13 0049

## 2013-04-24 NOTE — Assessment & Plan Note (Signed)
ADDENDUM ==> addended 04/24/2013 - spoke with patient and daughter on the phone, they endorse sacral wound that has progressively worsened - I was not aware of this - did not evaluate this on Friday.  I have placed wound care referral for asap evaluation at home.  Pt has significant difficulty with transportation for coming into office for visits.

## 2013-04-24 NOTE — ED Provider Notes (Signed)
Patient is not ambulatory. He has metastatic prostate cancer with metastases to his bones. His wife reports 4 days ago someone was helping putting in a vehicle and picked him up and somehow he got his right arm caught on the door. He started having swelling of his arm the next day. Wife is given copy of his x-ray.  Medical screening examination/treatment/procedure(s) were conducted as a shared visit with non-physician practitioner(s) and myself.  I personally evaluated the patient during the encounter  Devoria Albe, MD, Franz Dell, MD 04/24/13 [phone]

## 2013-04-24 NOTE — Addendum Note (Signed)
Addended by: Eustaquio Boyden on: 04/24/2013 03:17 PM   Modules accepted: Orders

## 2013-04-25 NOTE — ED Notes (Signed)
[facility] called for transport.  

## 2013-04-25 NOTE — ED Provider Notes (Signed)
See prior note   Ward Givens, MD 04/25/13 [phone]

## 2013-05-03 ENCOUNTER — Telehealth: Payer: Self-pay | Admitting: Family Medicine

## 2013-05-03 ENCOUNTER — Other Ambulatory Visit: Payer: Self-pay | Admitting: Family Medicine

## 2013-05-03 MED ORDER — ALPRAZOLAM 0.5 MG PO TABS: 0.5000 mg | ORAL_TABLET | Freq: Two times a day (BID) | ORAL | Status: AC | PRN

## 2013-05-03 NOTE — Telephone Encounter (Signed)
Spoke with wife for an update. Plz phone in alprazolam.

## 2013-05-04 DIAGNOSIS — E46 Unspecified protein-calorie malnutrition: Secondary | ICD-10-CM

## 2013-05-04 DIAGNOSIS — C61 Malignant neoplasm of prostate: Secondary | ICD-10-CM

## 2013-05-04 DIAGNOSIS — C801 Malignant (primary) neoplasm, unspecified: Secondary | ICD-10-CM

## 2013-05-04 DIAGNOSIS — D6182 Myelophthisis: Secondary | ICD-10-CM

## 2013-05-04 NOTE — Telephone Encounter (Signed)
Rx called in as directed.   

## 2013-05-11 ENCOUNTER — Telehealth: Payer: Self-pay

## 2013-05-11 NOTE — Telephone Encounter (Signed)
Zachary Mcguire with Hospice says she called in this request after speaking with the wife who said he was having a problem swalling the pain med.  However, after that, she spoke with the patient and he says his pain level is an 8 out of 10 but he doesn't want to take the pain med because it makes him sleep and he wants to be alert.  Zachary Mcguire would like to hold off on anything different right now until she can talk with the patient again and she if she can help him to find a dosage that would be more agreeable with him such as one half tablet instead of whole tab so that maybe it wouldn't make him sleep so much.  Zachary Mcguire will contact us again if any adjustments need to be made.

## 2013-05-11 NOTE — Telephone Encounter (Signed)
Cindy nurse with [facility] left v/m ; pt is having problem swallowing pain med; Arline Asp request Fentanyl 25 mcg patch faxed to [facility] [address] Cindy request this done today. Cindy request cb.

## 2013-05-11 NOTE — Telephone Encounter (Signed)
Noted, thanks, I'll sign off on this note.

## 2013-05-11 NOTE — Telephone Encounter (Signed)
What is his current total needed opiate dose per day?  We may need to change to roxanol in the meantime.  Let me know.

## 2013-05-21 ENCOUNTER — Telehealth: Payer: Self-pay | Admitting: Family Medicine

## 2013-05-21 NOTE — Telephone Encounter (Signed)
Spoke with wife, expressed my condolences.  

## 2013-05-23 DEATH — deceased

## 2013-06-12 ENCOUNTER — Telehealth: Payer: Self-pay | Admitting: Family Medicine

## 2013-06-12 NOTE — Telephone Encounter (Signed)
Ok to do this thanks. I am PCP, I was out of town on initial death certificate. Printed and placed in Kim's box.

## 2013-06-12 NOTE — Telephone Encounter (Signed)
Zachary Mcguire from [facility] at [facility]. Health [facility].  Original death cert signed by Dr. Para March, second death cert signed by Dr. Sharen Hones.  She needs a statement with letterhead stating that it was ok for Dr. Sharen Hones to sign for Dr. Para March and that additional information was needed on second death cert.  Fax to [phone].

## 2013-06-13 NOTE — Telephone Encounter (Signed)
Thanks to all

## 2013-06-13 NOTE — Telephone Encounter (Signed)
Letter faxed as directed.  

## 2015-02-24 IMAGING — CR DG HUMERUS 2V *R*
3 series · 3 of 3 positions shown · non-contrast
Comparison: None.

CLINICAL DATA: Arm pain, correlation with any chronic or

RIGHT HUMERUS - 2+ VIEW

[x humerus ap right]
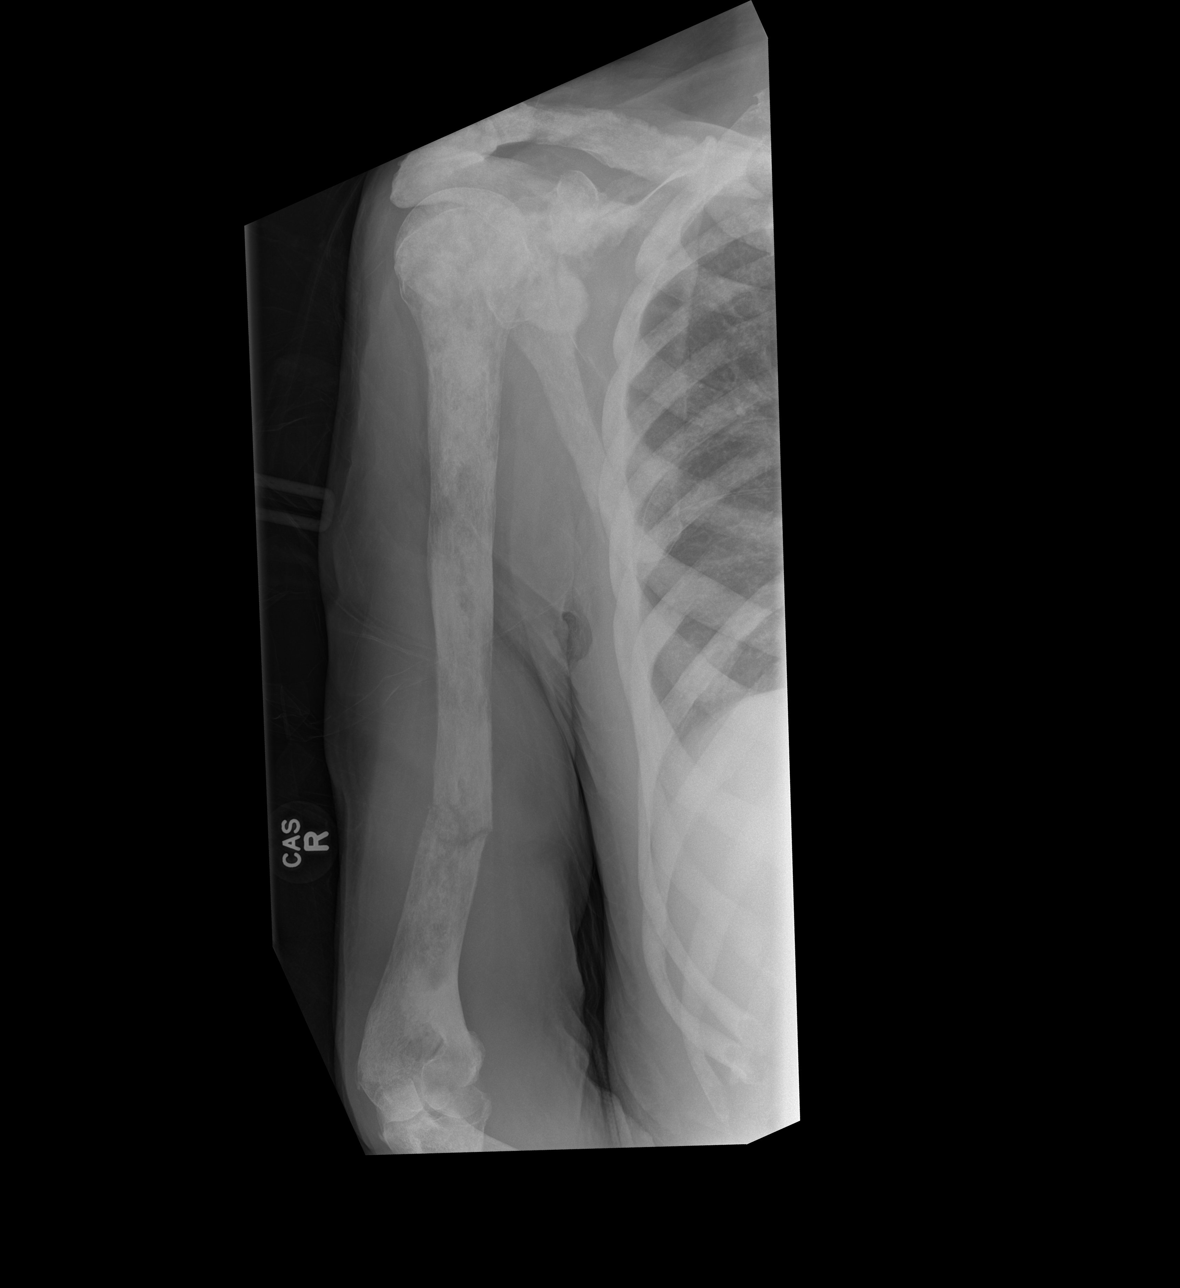

[x humerus lat right (1 of 2)]
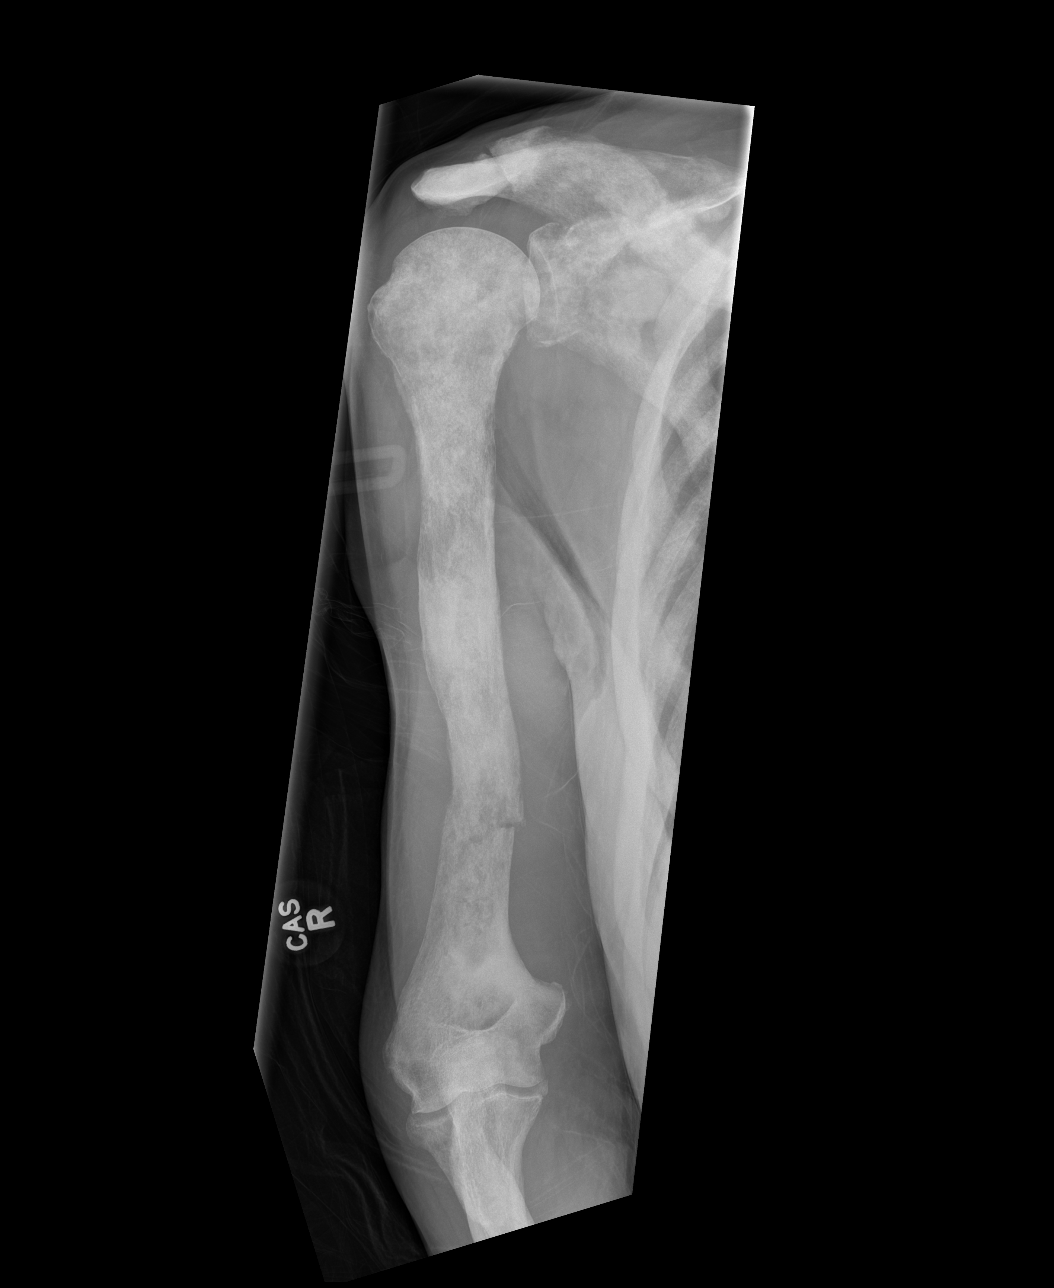

[x humerus lat right (2 of 2)]
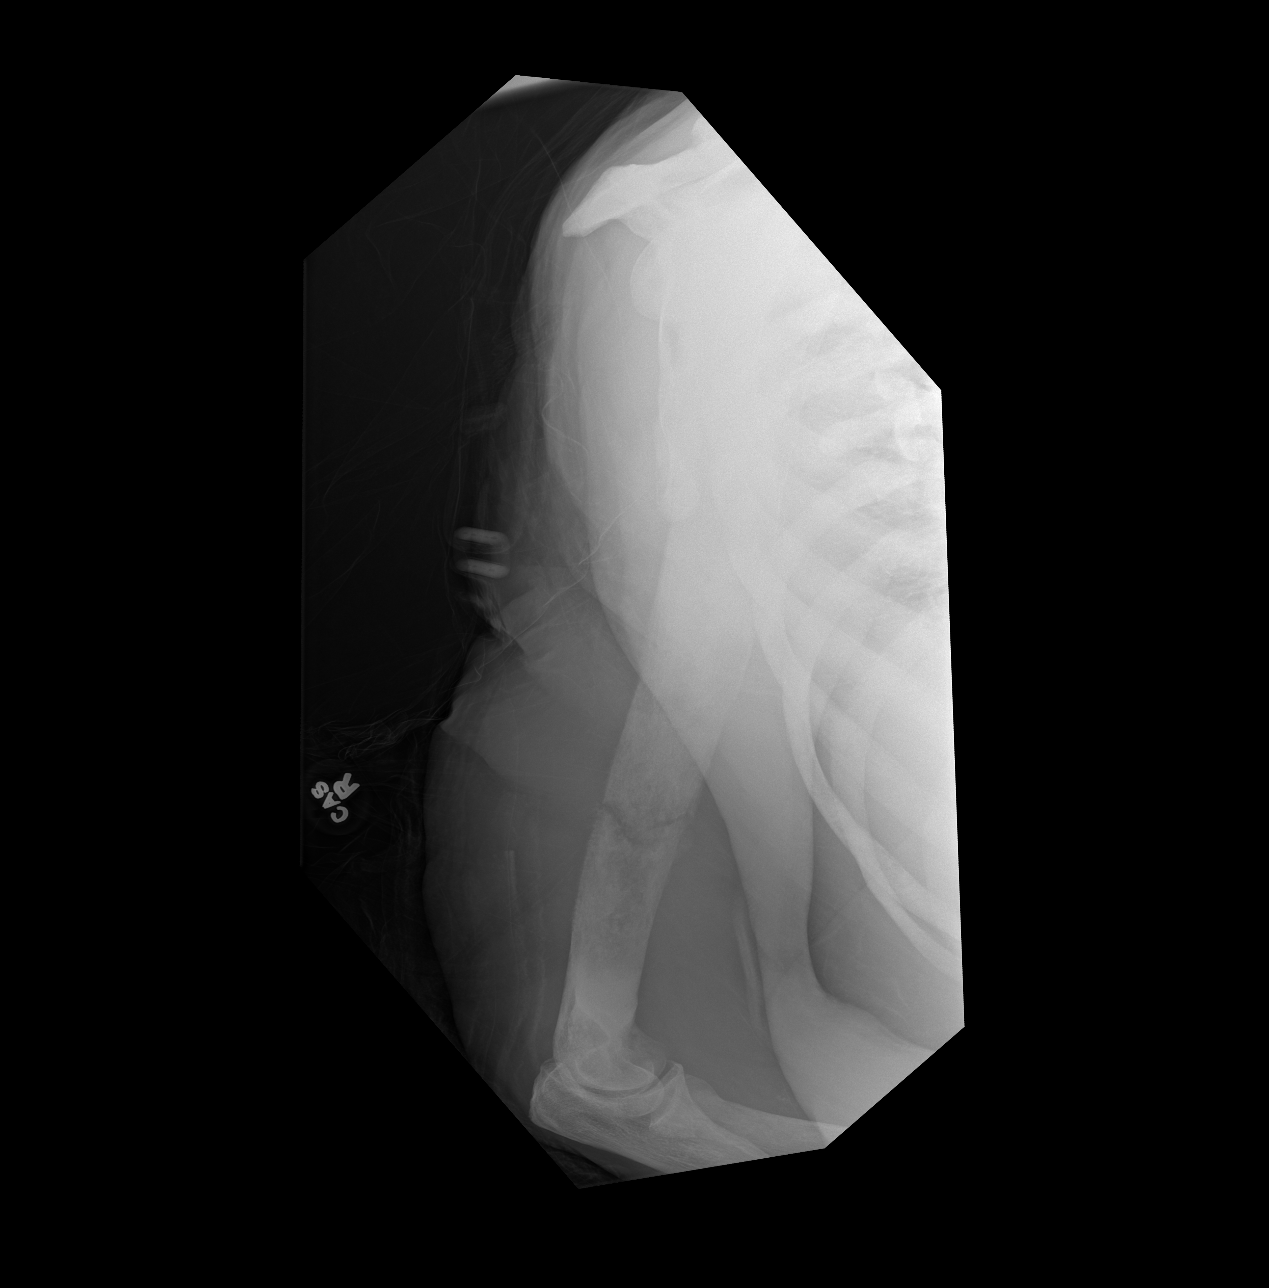

[3 of 3 positions shown; findings below may reference images not displayed]

FINDINGS: The visualized osseous structures demonstrate a diffusely
mottled appearance with heterogeneity of the cortex, concerning for
widespread osseous metastases. There is a transverse fracture
through the mid - distal right humeral shaft.  The right elbow and
shoulder remain grossly intact.  Mild associate soft tissue
swelling is present.
IMPRESSION: Diffuse osseous metastatic disease with acute fracture in the mid -
distal right humerus.
